# Patient Record
Sex: Female | Born: 1980 | Race: Black or African American | Hispanic: No | Marital: Married | Smoking: Former smoker
Health system: Southern US, Community
[De-identification: ages and names within clinical notes are randomized; demographics above are authoritative.]

## PROBLEM LIST (undated history)

## (undated) ENCOUNTER — Ambulatory Visit (HOSPITAL_COMMUNITY): Source: Home / Self Care

## (undated) DIAGNOSIS — R600 Localized edema: Secondary | ICD-10-CM

## (undated) DIAGNOSIS — R42 Dizziness and giddiness: Secondary | ICD-10-CM

## (undated) DIAGNOSIS — I10 Essential (primary) hypertension: Secondary | ICD-10-CM

## (undated) DIAGNOSIS — R002 Palpitations: Secondary | ICD-10-CM

## (undated) DIAGNOSIS — R079 Chest pain, unspecified: Secondary | ICD-10-CM

## (undated) HISTORY — DX: Dizziness and giddiness: R42

## (undated) HISTORY — DX: Chest pain, unspecified: R07.9

## (undated) HISTORY — DX: Essential (primary) hypertension: I10

## (undated) HISTORY — DX: Palpitations: R00.2

## (undated) HISTORY — PX: ABDOMINAL HYSTERECTOMY: SHX81

## (undated) HISTORY — PX: LAPAROSCOPIC HYSTERECTOMY: SHX1926

## (undated) HISTORY — PX: TONSILLECTOMY: SUR1361

## (undated) HISTORY — DX: Localized edema: R60.0

---

## 2013-06-28 DIAGNOSIS — R079 Chest pain, unspecified: Secondary | ICD-10-CM

## 2013-06-28 HISTORY — DX: Chest pain, unspecified: R07.9

## 2015-08-13 HISTORY — PX: ABDOMINAL HYSTERECTOMY: SHX81

## 2016-10-24 ENCOUNTER — Encounter: Payer: Self-pay | Admitting: Emergency Medicine

## 2016-10-24 ENCOUNTER — Emergency Department: Payer: Medicaid Other

## 2016-10-24 ENCOUNTER — Emergency Department
Admission: EM | Admit: 2016-10-24 | Discharge: 2016-10-24 | Disposition: A | Discharge status: Home / Self Care | Payer: Medicaid Other | Attending: Emergency Medicine | Admitting: Emergency Medicine

## 2016-10-24 DIAGNOSIS — M7989 Other specified soft tissue disorders: Secondary | ICD-10-CM | POA: Diagnosis not present

## 2016-10-24 DIAGNOSIS — Z87891 Personal history of nicotine dependence: Secondary | ICD-10-CM | POA: Diagnosis not present

## 2016-10-24 DIAGNOSIS — I10 Essential (primary) hypertension: Secondary | ICD-10-CM | POA: Diagnosis not present

## 2016-10-24 HISTORY — DX: Essential (primary) hypertension: I10

## 2016-10-24 LAB — BASIC METABOLIC PANEL
ANION GAP: 6 (ref 5–15)
BUN: 12 mg/dL (ref 6–20)
CALCIUM: 9.3 mg/dL (ref 8.9–10.3)
CHLORIDE: 104 mmol/L (ref 101–111)
CO2: 27 mmol/L (ref 22–32)
CREATININE: 0.81 mg/dL (ref 0.44–1.00)
GFR calc Af Amer: >60 mL/min (ref >60)
GFR calc non Af Amer: >60 mL/min (ref >60)
GLUCOSE: 120 mg/dL — AB (ref 65–99)
Potassium: 4 mmol/L (ref 3.5–5.1)
Sodium: 137 mmol/L (ref 135–145)

## 2016-10-24 LAB — CBC
HEMATOCRIT: 38.3 % (ref 35.0–47.0)
HEMOGLOBIN: 12.5 g/dL (ref 12.0–16.0)
MCH: 28.9 pg (ref 26.0–34.0)
MCHC: 32.7 g/dL (ref 32.0–36.0)
MCV: 88.1 fL (ref 80.0–100.0)
Platelets: 259 10*3/uL (ref 150–440)
RBC: 4.35 MIL/uL (ref 3.80–5.20)
RDW: 13.5 % (ref 11.5–14.5)
WBC: 7.7 10*3/uL (ref 3.6–11.0)

## 2016-10-24 LAB — TROPONIN I: Troponin I: <0.03 ng/mL (ref <0.03)

## 2016-10-24 MED ORDER — VERAPAMIL HCL 120 MG PO TABS: 120.0000 mg | ORAL_TABLET | Freq: Three times a day (TID) | ORAL | 1 refills | Status: DC

## 2016-10-24 NOTE — ED Provider Notes (Signed)
[facility], [facility] Emergency Department Provider Note  Time seen: 4:28 PM  I have reviewed the triage vital signs and the nursing notes.   HISTORY  Chief Complaint Leg Swelling and Medication Refill    HPI Christine Parks is a 36 y.o. female with a past medical history of hypertension presents to the emergency department with lower extremity swelling. According to the patient for the past month or 2 she has been experiencing intermittent swelling of her lower extremities. She states over the past one week her right lower extremity has been swollen more so than her left lower extremity. Denies any trauma to the area. Denies any discomfort. Patient denies chest pain or shortness of breath. Patient states she is supposed be taking estrogen therapy but has not been doing so. She also states approximately one week ago she developed a headache, states the headache comes and goes, denies any headache currently. Patient believes these symptoms are all related to having hypertension. States she has been unable to fill her verapamil for the past 3 months due to a Medicaid issue, and her not being able to get back in with her primary care doctor. Patient states she is currently working on this issue.  Past Medical History:  Diagnosis Date  . Hypertension     There are no active problems to display for this patient.   Past Surgical History:  Procedure Laterality Date  . ABDOMINAL HYSTERECTOMY    . TONSILLECTOMY      Prior to Admission medications   Not on File    Allergies  Allergen Reactions  . Penicillins Rash    No family history on file.  Social History Social History  Substance Use Topics  . Smoking status: Former Smoker    Types: Cigarettes    Quit date: 10/25/2014  . Smokeless tobacco: Never Used  . Alcohol use Yes     Comment: occasional     Review of Systems Constitutional: Negative for fever. Cardiovascular: Negative for chest pain. Respiratory:  Negative for shortness of breath. Gastrointestinal: Negative for abdominal pain Genitourinary: Negative for dysuria. Musculoskeletal: Bilateral lower extremity swelling at times, right lower extremity greater than left lower extremity. Denies any leg pain. Neurological: Intermittent headache 1 week 10-point ROS otherwise negative.  ____________________________________________   PHYSICAL EXAM:  VITAL SIGNS: ED Triage Vitals [10/24/16 1556]  Enc Vitals Group     BP (!) 175/98     Pulse Rate (!) 117     Resp 20     Temp 98 F (36.7 C)     Temp Source Oral     SpO2 100 %     Weight 220 lb (99.8 kg)     Height 5\' 4"  (1.626 m)     Head Circumference      Peak Flow      Pain Score      Pain Loc      Pain Edu?      Excl. in GC?     Constitutional: Alert and oriented. Well appearing and in no distress. Eyes: Normal exam ENT   Head: Normocephalic and atraumatic   Mouth/Throat: Mucous membranes are moist. Cardiovascular: Normal rate, regular rhythm. No murmur Respiratory: Normal respiratory effort without tachypnea nor retractions. Breath sounds are clear Gastrointestinal: Soft and nontender. No distention.   Musculoskeletal: Nontender with normal range of motion in all extremities. 1+ right lower extremity edema, no appreciable left lower extremity edema at this time. No calf tenderness in either lower extremity. 2+  DP pulse bilaterally. Neurologic:  Normal speech and language. No gross focal neurologic deficits  Skin:  Skin is warm, dry and intact.  Psychiatric: Mood and affect are normal.  ____________________________________________    INITIAL IMPRESSION / ASSESSMENT AND PLAN / ED COURSE  Pertinent labs & imaging results that were available during my care of the patient were reviewed by me and considered in my medical decision making (see chart for details).  The patient presents the emergency department with lower extremity swelling intermittent headache,  believes her symptoms are related to being off her blood pressure medications for the past 3 months. Patient is hypertensive 175/98 in the emergency department. We'll check basic labs including troponin, and a right lower extremity ultrasound. Overall the patient appears well, no distress, no complaints of chest pain or shortness of breath at any time. No leg discomfort. I discussed with the patient using compression stockings.  Ultrasound negative for DVT. Labs are largely within normal limits including negative troponin. We will discharge the patient with a refill for her verapamil. Patient will follow up with her primary care doctor.  ____________________________________________   FINAL CLINICAL IMPRESSION(S) / ED DIAGNOSES  Peripheral edema Hypertension    Minna Antis, MD 10/24/16 1725

## 2016-10-24 NOTE — ED Notes (Signed)
EDP at bedside  

## 2016-10-24 NOTE — ED Triage Notes (Signed)
Pt in via POV with complaints of intermittent BLE edema x approximately one week.  Pt also reports being out of blood pressure medication x 2-3 months; attempted to get medication refilled via cardiology but has been unable to due to insurance reasons..  Pt denies any pain at this time.  NAD noted at this time.

## 2016-10-24 NOTE — ED Notes (Signed)
Pt states bilateral leg swelling, worse in left then right, for 3 months, states she has been out of her Verapmil for 3 months, states she notices increased leg swelling when she wears non-supportive shoes, states hx of HTN, awake and alert, ambulatory

## 2016-10-28 ENCOUNTER — Telehealth: Payer: Self-pay | Admitting: Emergency Medicine

## 2016-10-28 NOTE — Telephone Encounter (Signed)
Patient called saying the verapamil is making her tired.  She has an appointment on Thursday with her cardiologist.  She has spoken to a pharmacist.  I explained the the physician who saw her here is not here now, and that it would be best for her to consult with the cardiologist on this.  I also spoke to her about why she had stopped the med for last few months.  She says she is going to have insurance from her job on [employer] [employer], but has applied for a transitional medicaid.  I told her about resources in the community for when she does not have insurance so that she will not stop the bp meds in the future.

## 2016-10-31 DIAGNOSIS — I10 Essential (primary) hypertension: Secondary | ICD-10-CM

## 2016-10-31 DIAGNOSIS — R002 Palpitations: Secondary | ICD-10-CM

## 2016-10-31 DIAGNOSIS — R600 Localized edema: Secondary | ICD-10-CM

## 2016-10-31 DIAGNOSIS — R42 Dizziness and giddiness: Secondary | ICD-10-CM

## 2016-10-31 HISTORY — DX: Palpitations: R00.2

## 2016-10-31 HISTORY — DX: Localized edema: R60.0

## 2016-10-31 HISTORY — DX: Essential (primary) hypertension: I10

## 2016-10-31 HISTORY — DX: Dizziness and giddiness: R42

## 2016-12-17 ENCOUNTER — Emergency Department (HOSPITAL_COMMUNITY)
Admission: EM | Admit: 2016-12-17 | Discharge: 2016-12-17 | Disposition: A | Discharge status: Home / Self Care | Payer: Medicaid Other | Attending: Emergency Medicine | Admitting: Emergency Medicine

## 2016-12-17 ENCOUNTER — Encounter (HOSPITAL_COMMUNITY): Payer: Self-pay | Admitting: Emergency Medicine

## 2016-12-17 DIAGNOSIS — I10 Essential (primary) hypertension: Secondary | ICD-10-CM | POA: Diagnosis not present

## 2016-12-17 DIAGNOSIS — Z87891 Personal history of nicotine dependence: Secondary | ICD-10-CM | POA: Insufficient documentation

## 2016-12-17 DIAGNOSIS — N3000 Acute cystitis without hematuria: Secondary | ICD-10-CM | POA: Diagnosis not present

## 2016-12-17 DIAGNOSIS — R300 Dysuria: Secondary | ICD-10-CM | POA: Diagnosis present

## 2016-12-17 LAB — URINALYSIS, ROUTINE W REFLEX MICROSCOPIC
Bilirubin Urine: NEGATIVE
Glucose, UA: NEGATIVE mg/dL
Hgb urine dipstick: NEGATIVE
KETONES UR: NEGATIVE mg/dL
Nitrite: NEGATIVE
Protein, ur: NEGATIVE mg/dL
Specific Gravity, Urine: 1.016 (ref 1.005–1.030)
pH: 5 (ref 5.0–8.0)

## 2016-12-17 LAB — CBC
HEMATOCRIT: 39.5 % (ref 36.0–46.0)
HEMOGLOBIN: 12.7 g/dL (ref 12.0–15.0)
MCH: 28.1 pg (ref 26.0–34.0)
MCHC: 32.2 g/dL (ref 30.0–36.0)
MCV: 87.4 fL (ref 78.0–100.0)
Platelets: 270 10*3/uL (ref 150–400)
RBC: 4.52 MIL/uL (ref 3.87–5.11)
RDW: 13.3 % (ref 11.5–15.5)
WBC: 4.8 10*3/uL (ref 4.0–10.5)

## 2016-12-17 LAB — BASIC METABOLIC PANEL
ANION GAP: 7 (ref 5–15)
BUN: 6 mg/dL (ref 6–20)
CALCIUM: 9 mg/dL (ref 8.9–10.3)
CO2: 25 mmol/L (ref 22–32)
Chloride: 106 mmol/L (ref 101–111)
Creatinine, Ser: 0.77 mg/dL (ref 0.44–1.00)
GFR calc Af Amer: >60 mL/min (ref >60)
Glucose, Bld: 99 mg/dL (ref 65–99)
POTASSIUM: 4.1 mmol/L (ref 3.5–5.1)
Sodium: 138 mmol/L (ref 135–145)

## 2016-12-17 MED ORDER — CEPHALEXIN 250 MG PO CAPS
500.0000 mg | ORAL_CAPSULE | Freq: Once | ORAL | Status: AC
  Administered 2016-12-17: 500 mg via ORAL
  Filled 2016-12-17: qty 2

## 2016-12-17 MED ORDER — ACETAMINOPHEN 500 MG PO TABS
1000.0000 mg | ORAL_TABLET | Freq: Once | ORAL | Status: AC
  Administered 2016-12-17: 1000 mg via ORAL
  Filled 2016-12-17: qty 2

## 2016-12-17 MED ORDER — PHENAZOPYRIDINE HCL 200 MG PO TABS: 200.0000 mg | ORAL_TABLET | Freq: Three times a day (TID) | ORAL | 0 refills | Status: DC

## 2016-12-17 MED ORDER — CEPHALEXIN 500 MG PO CAPS: 500.0000 mg | ORAL_CAPSULE | Freq: Two times a day (BID) | ORAL | 0 refills | Status: AC

## 2016-12-17 MED ORDER — PHENAZOPYRIDINE HCL 100 MG PO TABS
95.0000 mg | ORAL_TABLET | Freq: Once | ORAL | Status: AC
  Administered 2016-12-17: 100 mg via ORAL
  Filled 2016-12-17: qty 1

## 2016-12-17 NOTE — ED Triage Notes (Signed)
Pt c/o pain in abd and back, had a hysterectomy in 09/06/15-- gynecologist told pt that she had a "metal piece stuck in bladder-- and will have problems from that" pt was instructed to possibly f/u with urology-- pt has not had any f/u--   Pt c/o frequency/dysuria. With back pain--

## 2016-12-17 NOTE — Discharge Instructions (Signed)
Take your antibiotic as prescribed until completed. You may also take Tylenol or ibuprofen as prescribed over-the-counter Visine for chills and bodyaches. Continue turning fluids at home to remain hydrated. Please follow up with a primary care provider from the Resource Guide provided below in 3-4 days as needed. I recommend following up with the urology clinic listed below for further evaluation regarding your bladder/recurrent UTIs.  Please return to the Emergency Department if symptoms worsen or new onset of fever, back pain, flank pain, abdominal pain, vomiting, unable to keep fluids down, blood in urine, difficulty urinating.

## 2016-12-17 NOTE — ED Provider Notes (Signed)
[facility]-EMERGENCY DEPT Provider Note   CSN: [id number] Arrival date & time: 12/17/16  0865     History   Chief Complaint Chief Complaint  Patient presents with  . Nausea  . Fatigue  . Back Pain  . Urinary Tract Infection    HPI Christine Parks is a 36 y.o. female.  HPI   Patient is a 36 year old female with history of hypertension who presents the ED with complaint of UTI. Patient reports over the past week she has had dysuria, urinary frequency, chills and body aches. She reports having intermittent pain in her lower back. She also reports having one episode of vomiting yesterday. She notes her symptoms feel consistent with when she has had UTIs in the past. Patient states she had a hysterectomy performed in January 2017 and states her gynecologist told her she had a small piece of metal in her bladder and advised her to follow-up with a urologist due to having possible issues related to finding. Patient states she has not followed up with the urologist but notes she has had frequent UTIs over the past year. Denies any recent use of antibiotics. Denies fever, chest pain, shortness of breath, abdominal pain, hematemesis, flank pain, hematuria, vaginal bleeding, vaginal discharge.  Past Medical History:  Diagnosis Date  . Hypertension     There are no active problems to display for this patient.   Past Surgical History:  Procedure Laterality Date  . ABDOMINAL HYSTERECTOMY    . LAPAROSCOPIC HYSTERECTOMY    . TONSILLECTOMY      OB History    No data available       Home Medications    Prior to Admission medications   Medication Sig Start Date End Date Taking? Authorizing Provider  cephALEXin (KEFLEX) 500 MG capsule Take 1 capsule (500 mg total) by mouth 2 (two) times daily. 12/17/16 12/24/16  Barrett Henle, PA-C  phenazopyridine (PYRIDIUM) 200 MG tablet Take 1 tablet (200 mg total) by mouth 3 (three) times daily. 12/17/16   Barrett Henle, PA-C    verapamil (CALAN) 120 MG tablet Take 1 tablet (120 mg total) by mouth 3 (three) times daily. 10/24/16   Minna Antis, MD    Family History No family history on file.  Social History Social History  Substance Use Topics  . Smoking status: Former Smoker    Types: Cigarettes    Quit date: 10/25/2014  . Smokeless tobacco: Never Used  . Alcohol use Yes     Comment: occasional      Allergies   Penicillins   Review of Systems Review of Systems  Constitutional: Positive for chills.  Gastrointestinal: Positive for nausea and vomiting.  Genitourinary: Positive for dysuria and frequency.  Musculoskeletal: Positive for myalgias (generalized).  All other systems reviewed and are negative.    Physical Exam Updated Vital Signs BP 117/81   Pulse 72   Temp 98.7 F (37.1 C) (Oral)   Resp 16   SpO2 100%   Physical Exam  Constitutional: She is oriented to person, place, and time. She appears well-developed and well-nourished. No distress.  HENT:  Head: Normocephalic and atraumatic.  Mouth/Throat: Oropharynx is clear and moist. No oropharyngeal exudate.  Eyes: Conjunctivae and EOM are normal. Right eye exhibits no discharge. Left eye exhibits no discharge. No scleral icterus.  Neck: Normal range of motion. Neck supple.  Cardiovascular: Normal rate, regular rhythm, normal heart sounds and intact distal pulses.   Pulmonary/Chest: Effort normal and breath sounds normal. No respiratory distress.  She has no wheezes. She has no rales. She exhibits no tenderness.  Abdominal: Soft. Normal appearance and bowel sounds are normal. She exhibits no distension and no mass. There is no tenderness. There is no rigidity, no rebound, no guarding and no CVA tenderness. No hernia.  Musculoskeletal: Normal range of motion. She exhibits no edema.  Neurological: She is alert and oriented to person, place, and time.  Skin: Skin is warm and dry. She is not diaphoretic.  Nursing note and vitals  reviewed.    ED Treatments / Results  Labs (all labs ordered are listed, but only abnormal results are displayed) Labs Reviewed  URINALYSIS, ROUTINE W REFLEX MICROSCOPIC - Abnormal; Notable for the following:       Result Value   APPearance HAZY (*)    Leukocytes, UA LARGE (*)    Bacteria, UA MANY (*)    Squamous Epithelial / LPF 0-5 (*)    All other components within normal limits  URINE CULTURE  BASIC METABOLIC PANEL  CBC    EKG  EKG Interpretation None       Radiology No results found.  Procedures Procedures (including critical care time)  Medications Ordered in ED Medications  cephALEXin (KEFLEX) capsule 500 mg (500 mg Oral Given 12/17/16 1155)  phenazopyridine (PYRIDIUM) tablet 100 mg (100 mg Oral Given 12/17/16 1154)  acetaminophen (TYLENOL) tablet 1,000 mg (1,000 mg Oral Given 12/17/16 1155)     Initial Impression / Assessment and Plan / ED Course  I have reviewed the triage vital signs and the nursing notes.  Pertinent labs & imaging results that were available during my care of the patient were reviewed by me and considered in my medical decision making (see chart for details).     Patient presents with symptoms of UTI for the past week. Endorses associated chills, body aches, nausea and intermittent low back pain. Denies abdominal pain. Denies any recent antibiotic use but notes she has had frequent UTIs over the past year due to having a piece of metal in her bladder which she has not followed up with urology. VSS. Exam unremarkable. Abdomen soft, nontender. No CVA tenderness. UA shows evidence of infection, no hgb to suggest kidney stone. Labs unremarkable. Suspect pt's sxs related to UTI. No concern for pyelonephritis, kidney stone or other acute intraabdominal etiology warranting further workup at this time. Pt given initial dose of antibiotics in the ED. Urine culture sent due to pt reporting hx of recurrent UTIs. Discussed results and plan for discharge with  patient. Patient given information to follow-up with urology regarding her reported foreign body in bladder with history of recurrent UTIs. Discussed return precautions.  Final Clinical Impressions(s) / ED Diagnoses   Final diagnoses:  Acute cystitis without hematuria    New Prescriptions New Prescriptions   CEPHALEXIN (KEFLEX) 500 MG CAPSULE    Take 1 capsule (500 mg total) by mouth 2 (two) times daily.   PHENAZOPYRIDINE (PYRIDIUM) 200 MG TABLET    Take 1 tablet (200 mg total) by mouth 3 (three) times daily.     Barrett Henleadeau, Nicole Elizabeth, PA-C 12/17/16 1221    Alvira MondaySchlossman, Erin, MD 12/19/16 1743

## 2016-12-20 LAB — URINE CULTURE: Culture: >=100000 — AB

## 2016-12-21 ENCOUNTER — Telehealth: Payer: Self-pay

## 2016-12-21 NOTE — Telephone Encounter (Signed)
Post ED Visit - Positive Culture Follow-up  Culture report reviewed by antimicrobial stewardship pharmacist:  []  Enzo BiNathan Batchelder, Pharm.D. []  Celedonio MiyamotoJeremy Frens, Pharm.D., BCPS AQ-ID []  Garvin FilaMike Maccia, Pharm.D., BCPS []  Georgina PillionElizabeth Martin, Pharm.D., BCPS []  [place] BeachMinh Pham, VermontPharm.D., BCPS, AAHIVP []  Estella HuskMichelle Turner, Pharm.D., BCPS, AAHIVP []  Lysle Pearlachel Rumbarger, PharmD, BCPS []  Casilda Carlsaylor Stone, PharmD, BCPS []  Pollyann SamplesAndy Johnston, PharmD, BCPS [place] HunAllison Masters Pharm D Positive urine culture Treated with Cephalexin, organism sensitive to the same and no further patient follow-up is required at this time.  Jerry CarasCullom, Faiza Bansal Burnett 12/21/2016, 11:19 AM

## 2018-01-06 ENCOUNTER — Encounter: Payer: Self-pay | Admitting: Emergency Medicine

## 2018-01-06 ENCOUNTER — Emergency Department
Admission: EM | Admit: 2018-01-06 | Discharge: 2018-01-06 | Disposition: A | Discharge status: Home / Self Care | Payer: Self-pay | Attending: Emergency Medicine | Admitting: Emergency Medicine

## 2018-01-06 DIAGNOSIS — R000 Tachycardia, unspecified: Secondary | ICD-10-CM | POA: Insufficient documentation

## 2018-01-06 DIAGNOSIS — I10 Essential (primary) hypertension: Secondary | ICD-10-CM | POA: Insufficient documentation

## 2018-01-06 DIAGNOSIS — Z87891 Personal history of nicotine dependence: Secondary | ICD-10-CM | POA: Insufficient documentation

## 2018-01-06 DIAGNOSIS — R42 Dizziness and giddiness: Secondary | ICD-10-CM | POA: Insufficient documentation

## 2018-01-06 LAB — BASIC METABOLIC PANEL
Anion gap: 10 (ref 5–15)
BUN: 11 mg/dL (ref 6–20)
CHLORIDE: 106 mmol/L (ref 101–111)
CO2: 21 mmol/L — ABNORMAL LOW (ref 22–32)
CREATININE: 0.75 mg/dL (ref 0.44–1.00)
Calcium: 9.2 mg/dL (ref 8.9–10.3)
GFR calc Af Amer: >60 mL/min (ref >60)
GFR calc non Af Amer: >60 mL/min (ref >60)
GLUCOSE: 108 mg/dL — AB (ref 65–99)
POTASSIUM: 3.6 mmol/L (ref 3.5–5.1)
SODIUM: 137 mmol/L (ref 135–145)

## 2018-01-06 LAB — CBC
HEMATOCRIT: 40.1 % (ref 35.0–47.0)
Hemoglobin: 13.5 g/dL (ref 12.0–16.0)
MCH: 29.3 pg (ref 26.0–34.0)
MCHC: 33.5 g/dL (ref 32.0–36.0)
MCV: 87.3 fL (ref 80.0–100.0)
PLATELETS: 234 10*3/uL (ref 150–440)
RBC: 4.6 MIL/uL (ref 3.80–5.20)
RDW: 13 % (ref 11.5–14.5)
WBC: 8 10*3/uL (ref 3.6–11.0)

## 2018-01-06 LAB — GLUCOSE, CAPILLARY: Glucose-Capillary: 96 mg/dL (ref 65–99)

## 2018-01-06 MED ORDER — VERAPAMIL HCL ER 120 MG PO TBCR: 120.0000 mg | EXTENDED_RELEASE_TABLET | Freq: Every day | ORAL | 0 refills | Status: DC

## 2018-01-06 MED ORDER — VERAPAMIL HCL 120 MG PO TABS
120.0000 mg | ORAL_TABLET | Freq: Once | ORAL | Status: AC
  Administered 2018-01-06: 120 mg via ORAL
  Filled 2018-01-06: qty 1

## 2018-01-06 MED ORDER — MECLIZINE HCL 25 MG PO TABS: 25.0000 mg | ORAL_TABLET | Freq: Three times a day (TID) | ORAL | 0 refills | Status: DC | PRN

## 2018-01-06 MED ORDER — MECLIZINE HCL 25 MG PO TABS
25.0000 mg | ORAL_TABLET | Freq: Once | ORAL | Status: AC
  Administered 2018-01-06: 25 mg via ORAL
  Filled 2018-01-06: qty 1

## 2018-01-06 MED ORDER — VERAPAMIL HCL ER 120 MG PO TBCR
120.0000 mg | EXTENDED_RELEASE_TABLET | Freq: Once | ORAL | Status: DC
  Filled 2018-01-06 (×2): qty 1

## 2018-01-06 NOTE — ED Triage Notes (Signed)
Pt ambulated with a steady gait to triage room. Pt arrived with complaints of dizziness present with changing of positions. Pt states "it's gotten better though but I got to get it checked out because I cannot be standing up and getting dizzy like that." 204/105 blood pressure 115 HR when standing.

## 2018-01-06 NOTE — ED Notes (Signed)
Dr. Siadecki at bedside 

## 2018-01-06 NOTE — ED Provider Notes (Signed)
[facility] Emergency Department Provider Note ____________________________________________   First MD Initiated Contact with Patient 01/06/18 1621     (approximate)  I have reviewed the triage vital signs and the nursing notes.   HISTORY  Chief Complaint Dizziness (with change of positions )    HPI Temprence Faith Rawe is a 37 y.o. female with PMH as noted below who presents with dizziness, intermittent, mainly occurring when she changes position or stands up from sitting or lying down, and relieved with lying down, and described both as lightheadedness, but also as a movement or spinning sensation.  She states it is worse when there is a lot of pollen.  She reports that has been happening intermittently over a few months, but worse in the last several days, with an episode earlier today.  The patient states that she was previously on verapamil for her blood pressure (and elevated heart rate) but self discontinued after the prescription expired late last year, because she felt that her dose was too high and she thought that her blood pressure was probably okay.  Patient denies headache, chest pain or difficulty breathing, palpitations, or any recent illness.  Past Medical History:  Diagnosis Date  . Hypertension     There are no active problems to display for this patient.   Past Surgical History:  Procedure Laterality Date  . ABDOMINAL HYSTERECTOMY    . LAPAROSCOPIC HYSTERECTOMY    . TONSILLECTOMY      Prior to Admission medications   Medication Sig Start Date End Date Taking? Authorizing Provider  meclizine (ANTIVERT) 25 MG tablet Take 1 tablet (25 mg total) by mouth 3 (three) times daily as needed for dizziness. 01/06/18   Dionne Bucy, MD  phenazopyridine (PYRIDIUM) 200 MG tablet Take 1 tablet (200 mg total) by mouth 3 (three) times daily. Patient not taking: Reported on 01/06/2018 12/17/16   Barrett Henle, PA-C  verapamil  (CALAN-SR) 120 MG CR tablet Take 1 tablet (120 mg total) by mouth daily. 01/07/18 02/06/18  Dionne Bucy, MD    Allergies Penicillins  No family history on file.  Social History Social History   Tobacco Use  . Smoking status: Former Smoker    Types: Cigarettes    Last attempt to quit: 10/25/2014    Years since quitting: 3.2  . Smokeless tobacco: Never Used  Substance Use Topics  . Alcohol use: Yes    Comment: occasional   . Drug use: No    Review of Systems  Constitutional: No fever/. Eyes: No neck pain. ENT: No neck pain. Cardiovascular: Denies chest pain. Respiratory: Denies shortness of breath. Gastrointestinal: No nausea or vomiting. Genitourinary: Negative for flank pain.  Musculoskeletal: Negative for back pain. Skin: Negative for rash. Neurological: Negative for headache.   ____________________________________________   PHYSICAL EXAM:  VITAL SIGNS: ED Triage Vitals  Enc Vitals Group     BP 01/06/18 1315 (!) 165/83     Pulse Rate 01/06/18 1315 (!) 120     Resp 01/06/18 1315 18     Temp 01/06/18 1315 98.7 F (37.1 C)     Temp Source 01/06/18 1315 Oral     SpO2 01/06/18 1315 100 %     Weight 01/06/18 1316 220 lb (99.8 kg)     Height 01/06/18 1316  (1.626 m)     Head Circumference --      Peak Flow --      Pain Score 01/06/18 1315 0     Pain Loc --  Pain Edu? --      Excl. in GC? --     Constitutional: Alert and oriented. Well appearing and in no acute distress. Eyes: Conjunctivae are normal.  EOMI. Head: Atraumatic. Nose: No congestion/rhinnorhea. Mouth/Throat: Mucous membranes are moist.   Neck: Normal range of motion.  Cardiovascular: Normal rate, regular rhythm. Grossly normal heart sounds.  Good peripheral circulation. Respiratory: Normal respiratory effort.  No retractions. Lungs CTAB. Gastrointestinal:  No distention.  Musculoskeletal: Extremities warm and well perfused.  Neurologic:  Normal speech and language.  Motor and  sensory intact in all extremities.  Normal coordination with no ataxia on finger-to-nose.  No gross focal neurologic deficits are appreciated.  Skin:  Skin is warm and dry. No rash noted. Psychiatric: Mood and affect are normal. Speech and behavior are normal.  ____________________________________________   LABS (all labs ordered are listed, but only abnormal results are displayed)  Labs Reviewed  BASIC METABOLIC PANEL - Abnormal; Notable for the following components:      Result Value   CO2 21 (*)    Glucose, Bld 108 (*)    All other components within normal limits  CBC  GLUCOSE, CAPILLARY  URINALYSIS, COMPLETE (UACMP) WITH MICROSCOPIC  CBG MONITORING, ED  POC URINE PREG, ED   ____________________________________________  EKG  ED ECG REPORT I, Dionne Bucy, the attending physician, personally viewed and interpreted this ECG.  Date: 01/06/2018 EKG Time: 1322 Rate: 125 Rhythm: Sinus tachycardia QRS Axis: normal Intervals: normal ST/T Wave abnormalities: n nonspecific lateral T wave flattening Narrative Interpretation: Nonspecific possible lateral T wave abnormalities with no evidence of acute ischemia; no prior EKG available for comparison    ____________________________________________  RADIOLOGY    ____________________________________________   PROCEDURES  Procedure(s) performed: No  Procedures  Critical Care performed: No ____________________________________________   INITIAL IMPRESSION / ASSESSMENT AND PLAN / ED COURSE  Pertinent labs & imaging results that were available during my care of the patient were reviewed by me and considered in my medical decision making (see chart for details).  37 year old female with past medical history of hypertension who has been noncompliant with her verapamil for the last several months presents with intermittent dizziness, with both a lightheadedness and a vertiginous component.  She denies any significant  associated symptoms.  No relevant past medical records available on review of patient's chart in Epic.  On exam, the patient is well-appearing.  She was somewhat hypertensive initially, and her heart rate was initially in the 120s although it is now around 100.  Patient reports a prior history of tachycardia, and states this is not unusual for her.  The remainder of the exam is unremarkable.  Neuro exam is normal.  Differential includes peripheral vertigo, nonspecific symptoms related to elevated blood pressure, dehydration.  There is no clinical evidence to suggest cardiac etiology.  Patient has nonspecific slowly flat T waves on EKG, but no chest pain or difficulty breathing or other symptoms suggestive of ACS, no indication for further cardiac work-up.  Patient's labs are unremarkable.  We will give a dose of meclizine, as well as a dose of verapamil which the patient was previously on, and reassess.  Anticipate discharge home with outpatient cardiology follow-up and plan to resume her antihypertensive.     ----------------------------------------- 6:05 PM on 01/06/2018 -----------------------------------------  Patient's heart rate was 98 when her last measure.  She reports improved symptoms.  She feels well to go home.  I have provided her with referral to [facility] health cardiology in [place] (where  she lives) since she does not currently have a cardiologist.  She has been given a one-month supply of verapamil which she previously was taking with good response.  She also has been given a prescription for meclizine.  Return precautions given, and the patient expresses understanding.  ____________________________________________   FINAL CLINICAL IMPRESSION(S) / ED DIAGNOSES  Final diagnoses:  Dizziness  Hypertension, unspecified type      NEW MEDICATIONS STARTED DURING THIS VISIT:  New Prescriptions   MECLIZINE (ANTIVERT) 25 MG TABLET    Take 1 tablet (25 mg total) by mouth 3  (three) times daily as needed for dizziness.   VERAPAMIL (CALAN-SR) 120 MG CR TABLET    Take 1 tablet (120 mg total) by mouth daily.     Note:  This document was prepared using Dragon voice recognition software and may include unintentional dictation errors.    Dionne Bucy, MD 01/06/18 1806

## 2018-01-06 NOTE — Discharge Instructions (Signed)
Take the verapamil as prescribed starting tomorrow.  We have given you a 1 month supply so that you have enough until you follow-up with a new cardiologist.  You may take the meclizine as needed for dizziness or spinning.  Return to the ER for new, worsening, or persistent dizziness, lightheadedness, headache, chest pain, difficulty breathing, or any other new or worsening symptoms that concern you.

## 2018-01-06 NOTE — ED Notes (Signed)
Called pharmacy stating that verapamil has not been received in ED. This RN stated that she could see that it had been verified. Pharmacy stated they would send it again. Awaiting on medication at this time. Informed pt in delay of care.

## 2018-01-06 NOTE — ED Notes (Signed)
Pt c/o dizziness that began this AM. States she hasn't taken BP meds at all in 2019 because she doesn't like how they make her feel. States her doctor has changed her BP before but both have made her feel bad and she doesn't take anything for BP at this time. Alert, oriented, ambulated to 1H. Speaking in clear, complete sentences. No distress noted.

## 2018-01-15 DIAGNOSIS — R9431 Abnormal electrocardiogram [ECG] [EKG]: Secondary | ICD-10-CM | POA: Insufficient documentation

## 2018-01-15 HISTORY — DX: Abnormal electrocardiogram (ECG) (EKG): R94.31

## 2018-01-15 NOTE — Progress Notes (Deleted)
Cardiology Office Note:    Date:  01/16/2018   ID:  Christine Parks, DOB 13-Jan-1981, MRN [id number]  PCP:  Mosetta PuttBlomgren, Peter, MD  Cardiologist:  Norman HerrlichBrian Munley, MD   Referring MD: Mosetta PuttBlomgren, Peter, MD  ASSESSMENT:    1. Abnormal EKG    PLAN:    In order of problems listed above:  1. ***  Next appointment   Medication Adjustments/Labs and Tests Ordered: Current medicines are reviewed at length with the patient today.  Concerns regarding medicines are outlined above.  No orders of the defined types were placed in this encounter.  No orders of the defined types were placed in this encounter.    No chief complaint on file. ***  History of Present Illness:    Christine Parks is a 37 y.o. female who is being seen today for the evaluation of an abnormal EKG after [facility]  [facility] with elevated BP and dizziness at the request of Mosetta PuttBlomgren, Peter, MD.  EKG 01/06/18 showed sinus tachycardia 125 BPM and non specific T waves.   She was seen by cardiology in 2014 for chest pain and 2018 for hypertesnion.  Past Medical History:  Diagnosis Date  . Bilateral leg edema 10/31/2016   Last Assessment & Plan:  Patient does have bilateral lower extremity edema she says that is coming from the verapamil 120 3 times a day since the dose is higher than her regular dose of 240 could be contributing for it we'll try to change her to Verapamil SR 240 mg a day and see how she does if she continues to have lower extremity edema we'll check echocardiogram.  . Chest pain 06/28/2013   Last Assessment & Plan:  Patient reports intermittent chest pain accompanied by dyspnea and anxiety.  Her pain has pleuritic component to it.  However patient is unable to exercise the past several month due to complaints of exertional dyspnea.  She tells that she feels unsafe.  Therefore will investigate with exercise echocardiogram.  . Dizziness 10/31/2016   Last Assessment & Plan:  As above.  We'll try to change  her to Verapamil SR and see how she does if she continues to have problems then we will consider changing it.  . Essential hypertension 10/31/2016   Last Assessment & Plan:  Patient with known hypertension did very well with the meropenem will SR 240 mg a day.  Her primary was giving her verapamil 120 mg 3 times a day and which is not helping her blood pressure on top of that she is having a lot of side effects with the edema and dyspnea on exertion and palpitations.  She wants to get back on her regular where panel to 40 mg SR so we'll go ahe  . Hypertension   . Palpitations 10/31/2016   Last Assessment & Plan:  Patient says that palpitations are also happening since she is on short-acting meropenem L plan as about we'll change it and really reevaluate how the palpitations are.    Past Surgical History:  Procedure Laterality Date  . ABDOMINAL HYSTERECTOMY    . LAPAROSCOPIC HYSTERECTOMY    . TONSILLECTOMY      Current Medications: No outpatient medications have been marked as taking for the 01/16/18 encounter (Appointment) with Baldo DaubMunley, Brian J, MD.     Allergies:   Penicillins   Social History   Socioeconomic History  . Marital status: Married    Spouse name: Not on file  . Number of children: Not on  file  . Years of education: Not on file  . Highest education level: Not on file  Occupational History  . Not on file  Social Needs  . Financial resource strain: Not on file  . Food insecurity:    Worry: Not on file    Inability: Not on file  . Transportation needs:    Medical: Not on file    Non-medical: Not on file  Tobacco Use  . Smoking status: Former Smoker    Types: Cigarettes    Last attempt to quit: 10/25/2014    Years since quitting: 3.2  . Smokeless tobacco: Never Used  Substance and Sexual Activity  . Alcohol use: Yes    Comment: occasional   . Drug use: No  . Sexual activity: Yes  Lifestyle  . Physical activity:    Days per week: Not on file    Minutes per  session: Not on file  . Stress: Not on file  Relationships  . Social connections:    Talks on phone: Not on file    Gets together: Not on file    Attends religious service: Not on file    Active member of club or organization: Not on file    Attends meetings of clubs or organizations: Not on file    Relationship status: Not on file  Other Topics Concern  . Not on file  Social History Narrative  . Not on file     Family History: The patient's ***family history is not on file.  ROS:   ROS Please see the history of present illness.    *** All other systems reviewed and are negative.  EKGs/Labs/Other Studies Reviewed:    The following studies were reviewed today: ***  EKG:  EKG is *** ordered today.  The ekg ordered today demonstrates ***  Recent Labs: 01/06/2018: BUN 11; Creatinine, Ser 0.75; Hemoglobin 13.5; Platelets 234; Potassium 3.6; Sodium 137  Recent Lipid Panel No results found for: CHOL, TRIG, HDL, CHOLHDL, VLDL, LDLCALC, LDLDIRECT  Physical Exam:    VS:  There were no vitals taken for this visit.    Wt Readings from Last 3 Encounters:  01/06/18 220 lb (99.8 kg)  10/24/16 220 lb (99.8 kg)     GEN: *** Well nourished, well developed in no acute distress HEENT: Normal NECK: No JVD; No carotid bruits LYMPHATICS: No lymphadenopathy CARDIAC: ***RRR, no murmurs, rubs, gallops RESPIRATORY:  Clear to auscultation without rales, wheezing or rhonchi  ABDOMEN: Soft, non-tender, non-distended MUSCULOSKELETAL:  No edema; No deformity  SKIN: Warm and dry NEUROLOGIC:  Alert and oriented x 3 PSYCHIATRIC:  Normal affect     Signed, Norman Herrlich, MD  01/16/2018 8:51 AM    [facility]

## 2018-01-16 ENCOUNTER — Ambulatory Visit: Payer: Self-pay | Admitting: Cardiology

## 2018-01-20 ENCOUNTER — Encounter: Payer: Self-pay | Admitting: Cardiology

## 2018-01-20 ENCOUNTER — Ambulatory Visit (INDEPENDENT_AMBULATORY_CARE_PROVIDER_SITE_OTHER): Payer: Self-pay | Admitting: Cardiology

## 2018-01-20 VITALS — BP 128/82 | HR 97 | Ht 64.0 in | Wt 256.1 lb

## 2018-01-20 DIAGNOSIS — R079 Chest pain, unspecified: Secondary | ICD-10-CM

## 2018-01-20 DIAGNOSIS — R002 Palpitations: Secondary | ICD-10-CM

## 2018-01-20 DIAGNOSIS — I10 Essential (primary) hypertension: Secondary | ICD-10-CM

## 2018-01-20 NOTE — Patient Instructions (Signed)
Medication Instructions:  Your physician recommends that you continue on your current medications as directed. Please refer to the Current Medication list given to you today.  Labwork: None  Testing/Procedures: Your physician has requested that you have a stress echocardiogram. For further information please visit [email or web address]. Please follow instruction sheet as given.  Your physician has recommended that you wear a holter monitor. Holter monitors are medical devices that record the heart's electrical activity. Doctors most often use these monitors to diagnose arrhythmias. Arrhythmias are problems with the speed or rhythm of the heartbeat. The monitor is a small, portable device. You can wear one while you do your normal daily activities. This is usually used to diagnose what is causing palpitations/syncope (passing out).  Follow-Up: Your physician recommends that you schedule a follow-up appointment in: 3 months  Any Other Special Instructions Will Be Listed Below (If Applicable).     If you need a refill on your cardiac medications before your next appointment, please call your pharmacy.   [facility]  Garey HamAshley A, RN, BSN    Cardiopulmonary Exercise Stress Test Cardiopulmonary exercise testing (CPET) is a test that checks how your heart and lungs react to exercise. This is called your exercise capacity. During this test, you will walk or run on a treadmill or pedal on a stationary bike while tests are done on your heart and lungs. You may have this test to:  See why you are short of breath.  Check for exercise intolerance.  See how your lungs work.  See how your heart works.  Check for how you are responding to a heart or lung rehabilitation program.  See if you have a heart or lung problem.  See if you are healthy enough to have surgery.  What happens before the procedure?  Follow instructions from your doctor about what you cannot eat or drink.  Ask your  doctor about changing or stopping your normal medicines. This is important if you take diabetes medicines or blood thinners.  Wear loose, comfortable clothing and shoes.  If you use an inhaler, bring it with you to the test. What happens during the procedure?  A blood pressure cuff will be placed on your arm.  Several stick-on patches (electrodes) will be placed on your chest. They will be attached to an electrocardiogram (EKG) machine.  A clip-on monitor that measures the amount of oxygen in your blood will be placed on your finger (pulse oximeter).  A clip will be placed on your nose and a mouthpiece will be placed in your mouth. This may be held in place with a headpiece. You will breathe through the mouthpiece during the test.  You will be asked to start exercising. You will be closely watched while you exercise.  The amount of effort for your exercise will be gradually increased.  During exercise, the test will measure: ? Your heart rate. ? Your heart rhythm. ? Your oxygen blood level. ? The amount of oxygen and carbon dioxide that you breathe out.  The test will end when: ? You have finished the test. ? You have reached your maximum ability to exercise. ? You have chest or leg pain, dizziness, or shortness of breath. The procedure may vary among doctors and hospitals. What happens after the procedure?  Your blood pressure and EKG will be checked to watch how you recover from the test. This information is not intended to replace advice given to you by your health care provider. Make sure you discuss  any questions you have with your health care provider. Document Released: 07/17/2009 Document Revised: 12/19/2015 Document Reviewed: 06/12/2015 Elsevier Interactive Patient Education  2018 [employer].  Holter Monitoring A Holter monitor is a small device that is used to detect abnormal heart rhythms. It clips to your clothing and is connected by wires to flat, sticky disks  (electrodes) that attach to your chest. It is worn continuously for 24-48 hours. Follow these instructions at home:  Wear your Holter monitor at all times, even while exercising and sleeping, for as long as directed by your health care provider.  Make sure that the Holter monitor is safely clipped to your clothing or close to your body as recommended by your health care provider.  Do not get the monitor or wires wet.  Do not put body lotion or moisturizer on your chest.  Keep your skin clean.  Keep a diary of your daily activities, such as walking and doing chores. If you feel that your heartbeat is abnormal or that your heart is fluttering or skipping a beat: ? Record what you are doing when it happens. ? Record what time of day the symptoms occur.  Return your Holter monitor as directed by your health care provider.  Keep all follow-up visits as directed by your health care provider. This is important. Get help right away if:  You feel lightheaded or you faint.  You have trouble breathing.  You feel pain in your chest, upper arm, or jaw.  You feel sick to your stomach and your skin is pale, cool, or damp.  You heartbeat feels unusual or abnormal. This information is not intended to replace advice given to you by your health care provider. Make sure you discuss any questions you have with your health care provider. Document Released: 04/26/2004 Document Revised: 01/04/2016 Document Reviewed: 03/07/2014 Elsevier Interactive Patient Education  [employer].

## 2018-01-20 NOTE — Progress Notes (Signed)
Cardiology Office Note:    Date:  01/20/2018   ID:  Christine Parks, DOB 10/30/1980, MRN [id number]  PCP:  Patient, No Pcp Per  Cardiologist:  Garwin Brothersajan R Zerah Hilyer, MD   Referring MD: Mosetta PuttBlomgren, Peter, MD    ASSESSMENT:    1. Essential hypertension   2. Chest pain, unspecified type   3. Palpitations    PLAN:    In order of problems listed above:  1. Primary prevention stressed with the patient.  Importance of compliance with diet and medications stressed and she vocalized understanding.  I reassured her about my findings. 2. In view of her symptoms she will undergo to 48-hour Holter monitor.  Her lipids are followed by her primary care physician. 3. I have scheduled her for a exercise stress echo in view of her symptoms. 4. Diet was discussed for obesity and risks of obesity explained and she vocalized understanding. 5. She knows to go to the nearest emergency room for any significant concerns.Patient will be seen in follow-up appointment in 3 months or earlier if the patient has any concerns    Medication Adjustments/Labs and Tests Ordered: Current medicines are reviewed at length with the patient today.  Concerns regarding medicines are outlined above.  No orders of the defined types were placed in this encounter.  No orders of the defined types were placed in this encounter.    History of Present Illness:    Christine Parks is a 37 y.o. female who is being seen today for the evaluation of chest tightness and palpitations at the request of Mosetta PuttBlomgren, Peter, MD.  Patient is a pleasant 37 year old female.  She has past medical history of essential hypertension.  [facility]  She is moved to this area.  She complained of some dizziness and went to the emergency room and was evaluated.  She was discharged with an appointment with us.  She denies any chest pain orthopnea or PND.  She occasionally has some chest tightness symptoms not  related to exertion.  She leads a sedentary lifestyle.  I asked in particular if sexual activity reproduces the symptoms and she answered in the negative.  No syncopal spells.  No orthopnea or PND.  She does have symptoms of palpitations at times pretty much on a daily basis.  At the time of my evaluation, the patient is alert awake oriented and in no distress.  Past Medical History:  Diagnosis Date  . Bilateral leg edema 10/31/2016   Last Assessment & Plan:  Patient does have bilateral lower extremity edema she says that is coming from the verapamil 120 3 times a day since the dose is higher than her regular dose of 240 could be contributing for it we'll try to change her to Verapamil SR 240 mg a day and see how she does if she continues to have lower extremity edema we'll check echocardiogram.  . Chest pain 06/28/2013   Last Assessment & Plan:  Patient reports intermittent chest pain accompanied by dyspnea and anxiety.  Her pain has pleuritic component to it.  However patient is unable to exercise the past several month due to complaints of exertional dyspnea.  She tells that she feels unsafe.  Therefore will investigate with exercise echocardiogram.  . Dizziness 10/31/2016   Last Assessment & Plan:  As above.  We'll try to change her to Verapamil SR and see how she does if she continues to have problems then we will consider changing  it.  . Essential hypertension 10/31/2016   Last Assessment & Plan:  Patient with known hypertension did very well with the meropenem will SR 240 mg a day.  Her primary was giving her verapamil 120 mg 3 times a day and which is not helping her blood pressure on top of that she is having a lot of side effects with the edema and dyspnea on exertion and palpitations.  She wants to get back on her regular where panel to 40 mg SR so we'll go ahe  . Hypertension   . Palpitations 10/31/2016   Last Assessment & Plan:  Patient says that palpitations are also happening since she is on  short-acting meropenem L plan as about we'll change it and really reevaluate how the palpitations are.    Past Surgical History:  Procedure Laterality Date  . ABDOMINAL HYSTERECTOMY    . LAPAROSCOPIC HYSTERECTOMY    . TONSILLECTOMY      Current Medications: Current Meds  Medication Sig  . meclizine (ANTIVERT) 25 MG tablet Take 1 tablet (25 mg total) by mouth 3 (three) times daily as needed for dizziness.  . verapamil (CALAN-SR) 120 MG CR tablet Take 1 tablet (120 mg total) by mouth daily.     Allergies:   Penicillins   Social History   Socioeconomic History  . Marital status: Married    Spouse name: Not on file  . Number of children: Not on file  . Years of education: Not on file  . Highest education level: Not on file  Occupational History  . Not on file  Social Needs  . Financial resource strain: Not on file  . Food insecurity:    Worry: Not on file    Inability: Not on file  . Transportation needs:    Medical: Not on file    Non-medical: Not on file  Tobacco Use  . Smoking status: Former Smoker    Types: Cigarettes    Last attempt to quit: 10/25/2014    Years since quitting: 3.2  . Smokeless tobacco: Never Used  Substance and Sexual Activity  . Alcohol use: Yes    Comment: occasional   . Drug use: No  . Sexual activity: Yes  Lifestyle  . Physical activity:    Days per week: Not on file    Minutes per session: Not on file  . Stress: Not on file  Relationships  . Social connections:    Talks on phone: Not on file    Gets together: Not on file    Attends religious service: Not on file    Active member of club or organization: Not on file    Attends meetings of clubs or organizations: Not on file    Relationship status: Not on file  Other Topics Concern  . Not on file  Social History Narrative  . Not on file     Family History: The patient's family history includes Diabetes in her father and mother; Hypertension in her father and mother.  ROS:     Please see the history of present illness.    All other systems reviewed and are negative.  EKGs/Labs/Other Studies Reviewed:    The following studies were reviewed today: EKG reveals sinus rhythm and nonspecific ST-T changes.   Recent Labs: 01/06/2018: BUN 11; Creatinine, Ser 0.75; Hemoglobin 13.5; Platelets 234; Potassium 3.6; Sodium 137  Recent Lipid Panel No results found for: CHOL, TRIG, HDL, CHOLHDL, VLDL, LDLCALC, LDLDIRECT  Physical Exam:    VS:  BP  128/82 (BP Location: Right Arm, Patient Position: Sitting, Cuff Size: Normal)   Pulse 97   Ht 5\' 4"  (1.626 m)   Wt 256 lb 1.9 oz (116.2 kg)   SpO2 98%   BMI 43.96 kg/m     Wt Readings from Last 3 Encounters:  01/20/18 256 lb 1.9 oz (116.2 kg)  01/06/18 220 lb (99.8 kg)  10/24/16 220 lb (99.8 kg)     GEN: Patient is in no acute distress HEENT: Normal NECK: No JVD; No carotid bruits LYMPHATICS: No lymphadenopathy CARDIAC: S1 S2 regular, 2/6 systolic murmur at the apex. RESPIRATORY:  Clear to auscultation without rales, wheezing or rhonchi  ABDOMEN: Soft, non-tender, non-distended MUSCULOSKELETAL:  No edema; No deformity  SKIN: Warm and dry NEUROLOGIC:  Alert and oriented x 3 PSYCHIATRIC:  Normal affect    Signed, Garwin Brothers, MD  01/20/2018 10:47 AM    [facility]

## 2018-01-20 NOTE — Addendum Note (Signed)
Addended by: Craige CottaANDERSON, Day Greb S on: 01/20/2018 10:52 AM   Modules accepted: Orders

## 2018-02-11 ENCOUNTER — Telehealth: Payer: Self-pay | Admitting: Cardiology

## 2018-02-11 MED ORDER — VERAPAMIL HCL ER 120 MG PO TBCR: 120.0000 mg | EXTENDED_RELEASE_TABLET | Freq: Every day | ORAL | 3 refills | Status: DC

## 2018-02-11 NOTE — Telephone Encounter (Signed)
Refill for verapamil 120 [place] daily [facility] [place] requested by patient.

## 2018-02-11 NOTE — Telephone Encounter (Signed)
Please send refill of verapamil 120mg  to [facility] on [address] [place]

## 2018-02-17 ENCOUNTER — Ambulatory Visit (HOSPITAL_BASED_OUTPATIENT_CLINIC_OR_DEPARTMENT_OTHER): Payer: Medicaid Other

## 2018-03-09 ENCOUNTER — Ambulatory Visit (HOSPITAL_BASED_OUTPATIENT_CLINIC_OR_DEPARTMENT_OTHER): Admission: RE | Admit: 2018-03-09 | Payer: Medicaid Other | Source: Ambulatory Visit

## 2018-03-10 ENCOUNTER — Encounter: Payer: Self-pay | Admitting: Cardiology

## 2018-05-13 ENCOUNTER — Emergency Department: Payer: Self-pay

## 2018-05-13 ENCOUNTER — Encounter: Payer: Self-pay | Admitting: Emergency Medicine

## 2018-05-13 ENCOUNTER — Emergency Department
Admission: EM | Admit: 2018-05-13 | Discharge: 2018-05-13 | Disposition: A | Discharge status: Home / Self Care | Payer: Self-pay | Attending: Emergency Medicine | Admitting: Emergency Medicine

## 2018-05-13 DIAGNOSIS — G5762 Lesion of plantar nerve, left lower limb: Secondary | ICD-10-CM | POA: Insufficient documentation

## 2018-05-13 DIAGNOSIS — Z79899 Other long term (current) drug therapy: Secondary | ICD-10-CM | POA: Insufficient documentation

## 2018-05-13 DIAGNOSIS — I10 Essential (primary) hypertension: Secondary | ICD-10-CM | POA: Insufficient documentation

## 2018-05-13 MED ORDER — MELOXICAM 15 MG PO TABS: 15.0000 mg | ORAL_TABLET | Freq: Every day | ORAL | 0 refills | Status: DC

## 2018-05-13 NOTE — ED Triage Notes (Signed)
Patient presents to the ED with left foot swelling and pain to the top of her foot x 3 weeks.  Patient is ambulatory to triage.  Patient states icing foot improves pain and swelling somewhat.  Patient is in no obvious distress at this time.

## 2018-05-13 NOTE — ED Provider Notes (Signed)
[facility] Emergency Department Provider Note  ____________________________________________  Time seen: Approximately 8:17 PM  I have reviewed the triage vital signs and the nursing notes.   HISTORY  Chief Complaint Foot Pain    HPI Christine Parks is a 37 y.o. female who presents the emergency department complaining of left foot pain.  Patient reports that approximately 3 weeks ago she began to experience a burning/tender sensation in her midfoot radiating into her toes.  Patient denies any trauma to the area.  She does have chronic, bilateral lower leg edema but denies any change from baseline.  She denies any visible swelling, erythema.  She denies any ecchymosis.  Patient reports that at rest she has little to no pain, but with weightbearing sensation returns.  No history of similar complaint in the past.  No other complaints at this time.    Past Medical History:  Diagnosis Date  . Bilateral leg edema 10/31/2016   Last Assessment & Plan:  Patient does have bilateral lower extremity edema she says that is coming from the verapamil 120 3 times a day since the dose is higher than her regular dose of 240 could be contributing for it we'll try to change her to Verapamil SR 240 mg a day and see how she does if she continues to have lower extremity edema we'll check echocardiogram.  . Chest pain 06/28/2013   Last Assessment & Plan:  Patient reports intermittent chest pain accompanied by dyspnea and anxiety.  Her pain has pleuritic component to it.  However patient is unable to exercise the past several month due to complaints of exertional dyspnea.  She tells that she feels unsafe.  Therefore will investigate with exercise echocardiogram.  . Dizziness 10/31/2016   Last Assessment & Plan:  As above.  We'll try to change her to Verapamil SR and see how she does if she continues to have problems then we will consider changing it.  . Essential hypertension 10/31/2016   Last Assessment & Plan:  Patient with known hypertension did very well with the meropenem will SR 240 mg a day.  Her primary was giving her verapamil 120 mg 3 times a day and which is not helping her blood pressure on top of that she is having a lot of side effects with the edema and dyspnea on exertion and palpitations.  She wants to get back on her regular where panel to 40 mg SR so we'll go ahe  . Hypertension   . Palpitations 10/31/2016   Last Assessment & Plan:  Patient says that palpitations are also happening since she is on short-acting meropenem L plan as about we'll change it and really reevaluate how the palpitations are.    Patient Active Problem List   Diagnosis Date Noted  . Abnormal EKG 01/15/2018  . Bilateral leg edema 10/31/2016  . Dizziness 10/31/2016  . Essential hypertension 10/31/2016  . Palpitations 10/31/2016  . Chest pain 06/28/2013    Past Surgical History:  Procedure Laterality Date  . ABDOMINAL HYSTERECTOMY    . LAPAROSCOPIC HYSTERECTOMY    . TONSILLECTOMY      Prior to Admission medications   Medication Sig Start Date End Date Taking? Authorizing Provider  meclizine (ANTIVERT) 25 MG tablet Take 1 tablet (25 mg total) by mouth 3 (three) times daily as needed for dizziness. 01/06/18   Dionne Bucy, MD  meloxicam (MOBIC) 15 MG tablet Take 1 tablet (15 mg total) by mouth daily. 05/13/18   Jazlyne Gauger, Delorise Royals,  PA-C  verapamil (CALAN-SR) 120 MG CR tablet Take 1 tablet (120 mg total) by mouth daily. 02/11/18 03/13/18  Revankar, Aundra Dubin, MD    Allergies Penicillins  Family History  Problem Relation Age of Onset  . Diabetes Mother   . Hypertension Mother   . Diabetes Father   . Hypertension Father     Social History Social History   Tobacco Use  . Smoking status: Former Smoker    Types: Cigarettes    Last attempt to quit: 10/25/2014    Years since quitting: 3.5  . Smokeless tobacco: Never Used  Substance Use Topics  . Alcohol use: Yes     Comment: occasional   . Drug use: No     Review of Systems  Constitutional: No fever/chills Eyes: No visual changes.  Cardiovascular: no chest pain. Respiratory: no cough. No SOB. Gastrointestinal: No abdominal pain.  No nausea, no vomiting. Musculoskeletal: Nontraumatic midfoot pain radiating into the toes Skin: Negative for rash, abrasions, lacerations, ecchymosis. Neurological: Negative for headaches, focal weakness or numbness. 10-point ROS otherwise negative.  ____________________________________________   PHYSICAL EXAM:  VITAL SIGNS: ED Triage Vitals  Enc Vitals Group     BP 05/13/18 2009 (!) 146/96     Pulse Rate 05/13/18 2009 89     Resp 05/13/18 2009 16     Temp 05/13/18 2009 97.8 F (36.6 C)     Temp Source 05/13/18 2009 Oral     SpO2 05/13/18 2009 98 %     Weight 05/13/18 1953 220 lb (99.8 kg)     Height 05/13/18 1953 5\' 4"  (1.626 m)     Head Circumference --      Peak Flow --      Pain Score 05/13/18 1953 7     Pain Loc --      Pain Edu? --      Excl. in GC? --      Constitutional: Alert and oriented. Well appearing and in no acute distress. Eyes: Conjunctivae are normal. PERRL. EOMI. Head: Atraumatic. Neck: No stridor.    Cardiovascular: Normal rate, regular rhythm. Normal S1 and S2.  Good peripheral circulation. Respiratory: Normal respiratory effort without tachypnea or retractions. Lungs CTAB. Good air entry to the bases with no decreased or absent breath sounds. Musculoskeletal: Full range of motion to all extremities. No gross deformities appreciated.  Visualization of the left foot reveals mild pedal edema.  This is consistent with either foot with no significant appreciable difference.  No erythema.  No ecchymosis.  No visible abrasion or laceration.  Patient is tender to palpation directly between the third and fourth metatarsal.  No palpable abnormality.  No other tenderness to palpation over the osseous structures of the foot.  Tenderness  occurred with palpation over the dorsal aspect and much less with plantar palpation.  Sensation intact all 5 digits.  Capillary refill intact all digits. Neurologic:  Normal speech and language. No gross focal neurologic deficits are appreciated.  Skin:  Skin is warm, dry and intact. No rash noted. Psychiatric: Mood and affect are normal. Speech and behavior are normal. Patient exhibits appropriate insight and judgement.   ____________________________________________   LABS (all labs ordered are listed, but only abnormal results are displayed)  Labs Reviewed - No data to display ____________________________________________  EKG   ____________________________________________  RADIOLOGY I personally viewed and evaluated these images as part of my medical decision making, as well as reviewing the written report by the radiologist.  Dg Foot Complete Left  Result Date: 05/13/2018 CLINICAL DATA:  Foot pain EXAM: LEFT FOOT - COMPLETE 3+ VIEW COMPARISON:  None. FINDINGS: There is no evidence of fracture or dislocation. There is no evidence of arthropathy or other focal bone abnormality. Dorsal soft tissue swelling without soft tissue emphysema or radiopaque foreign body. IMPRESSION: No acute osseous abnormality Electronically Signed   By: Jasmine Pang M.D.   On: 05/13/2018 20:38    ____________________________________________    PROCEDURES  Procedure(s) performed:    Procedures    Medications - No data to display   ____________________________________________   INITIAL IMPRESSION / ASSESSMENT AND PLAN / ED COURSE  Pertinent labs & imaging results that were available during my care of the patient were reviewed by me and considered in my medical decision making (see chart for details).  Review of the [place] CSRS was performed in accordance of the [facility] prior to dispensing any controlled drugs.      Patient's diagnosis is consistent with Morton's neuroma.  Patient presented to  the emergency department with midfoot pain radiating into the toes.  On exam, patient was tender to palpation between the third and fourth metatarsal.  No history of injury.  Patient does have pedal edema but no change from baseline.  Differential included cellulitis, fracture, stress fracture, foot strain, Morton's neuroma, metatarsal bursitis.. Patient will be discharged home with prescriptions for meloxicam. Patient is to follow up with podiatry as needed or otherwise directed. Patient is given ED precautions to return to the ED for any worsening or new symptoms.     ____________________________________________  FINAL CLINICAL IMPRESSION(S) / ED DIAGNOSES  Final diagnoses:  Morton's metatarsalgia, left      NEW MEDICATIONS STARTED DURING THIS VISIT:  ED Discharge Orders         Ordered    meloxicam (MOBIC) 15 MG tablet  Daily     05/13/18 2113              This chart was dictated using voice recognition software/Dragon. Despite best efforts to proofread, errors can occur which can change the meaning. Any change was purely unintentional.    Racheal Patches, PA-C 05/13/18 2125    Jeanmarie Plant, MD 05/13/18 2237

## 2018-05-29 IMAGING — US US EXTREM LOW VENOUS*R*
1 series · 13 of 24 positions shown · non-contrast
Comparison: None.

CLINICAL DATA: Right lower extremity pain and swelling, initial
encounter



[Series 1: us extrem low venous*right* · 0.08mm/px · 13 of 34 slices shown]
[im 1/34]
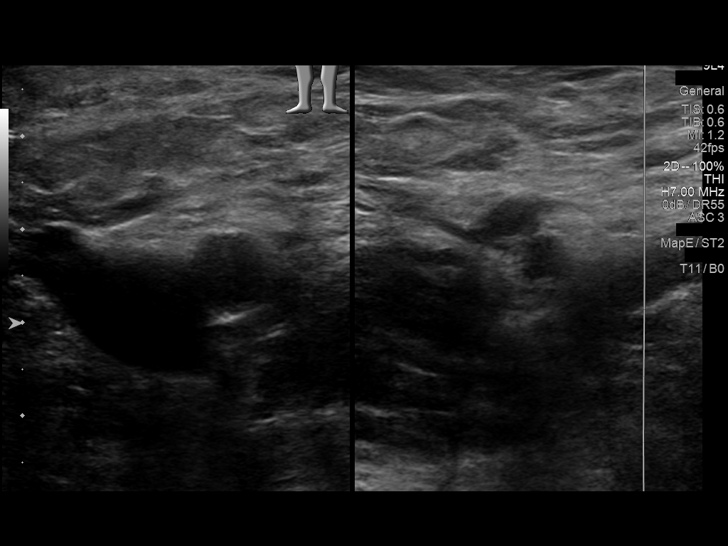
[im 3/34]
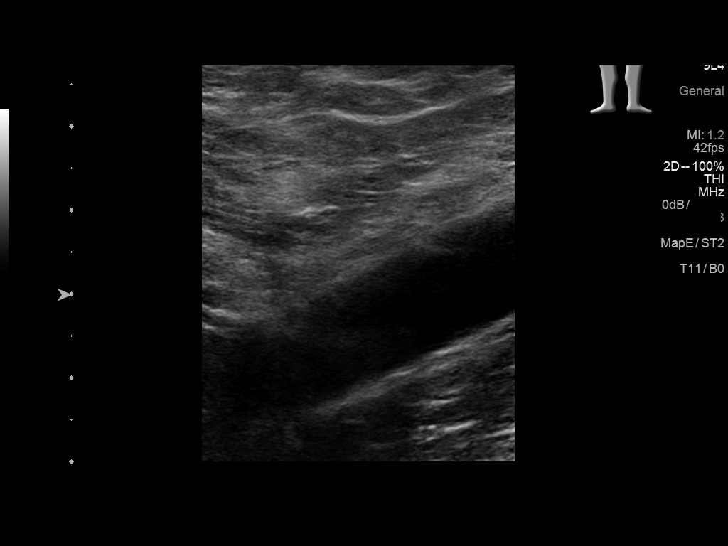
[im 6/34]
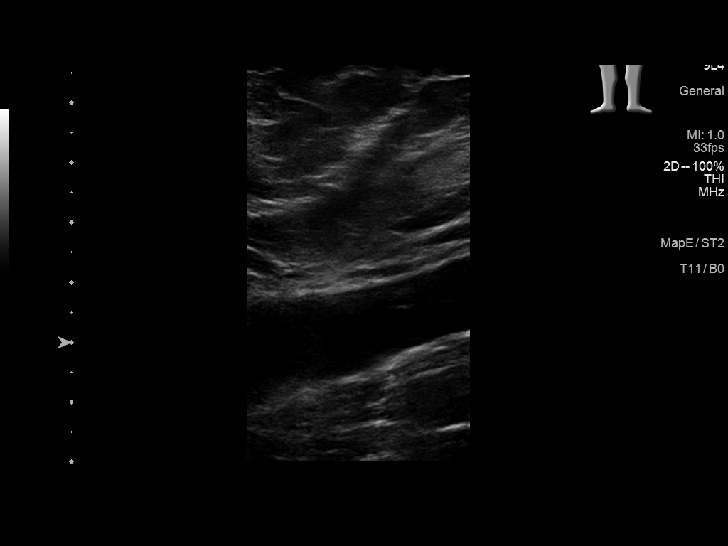
[im 9/34]
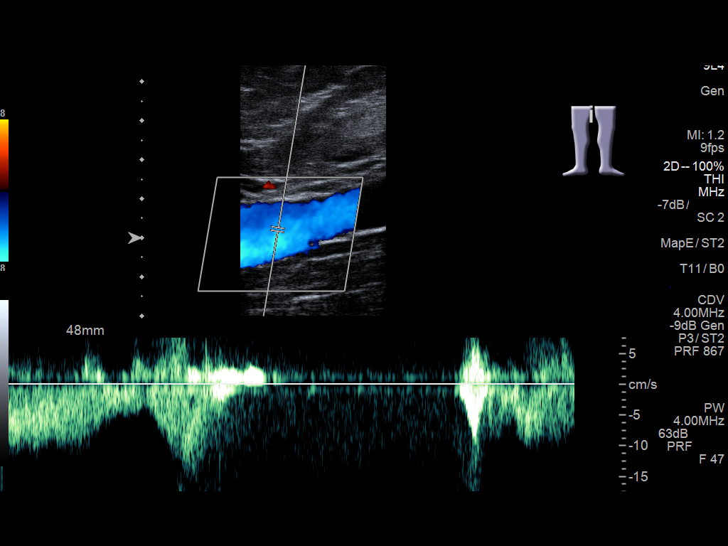
[im 12/34]
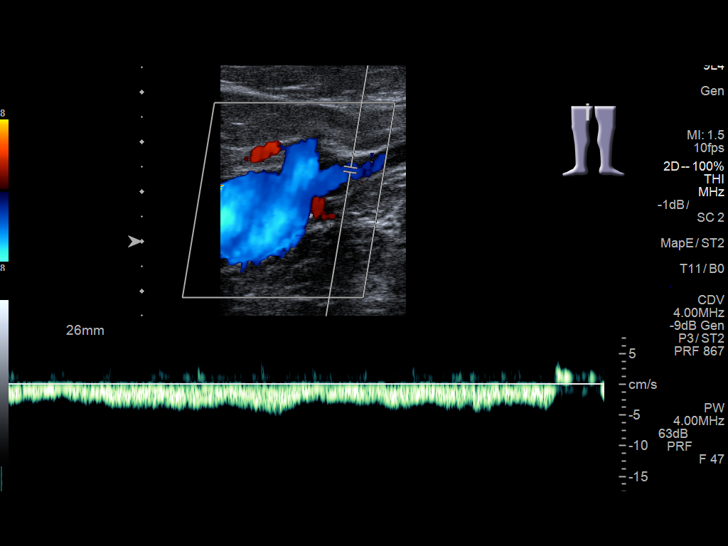
[im 15/34]
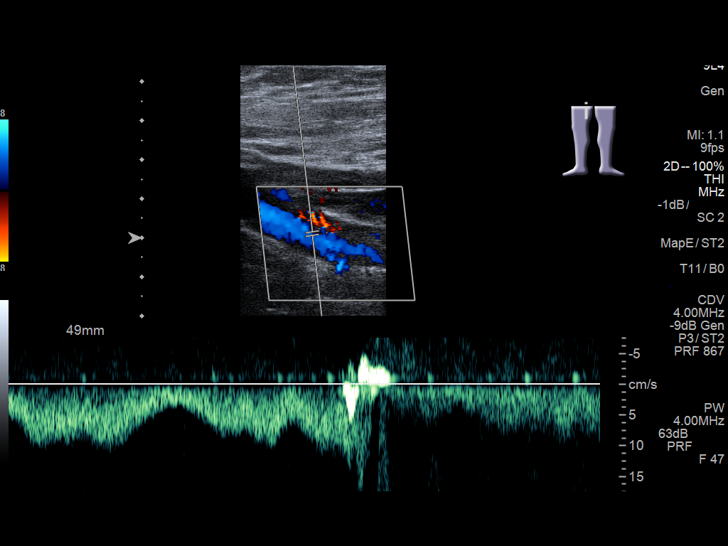
[im 18/34]
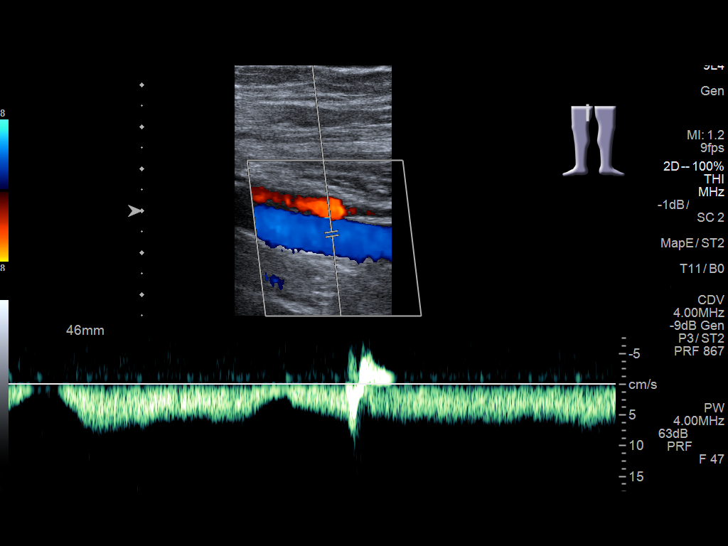
[im 19/34]
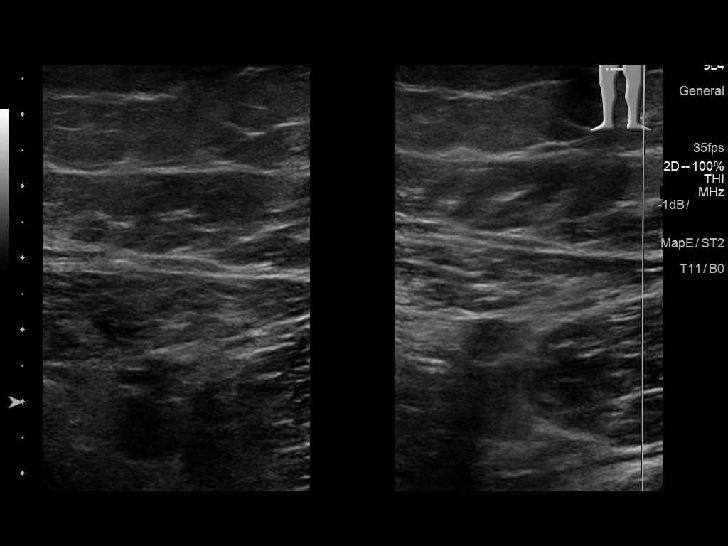
[im 22/34]
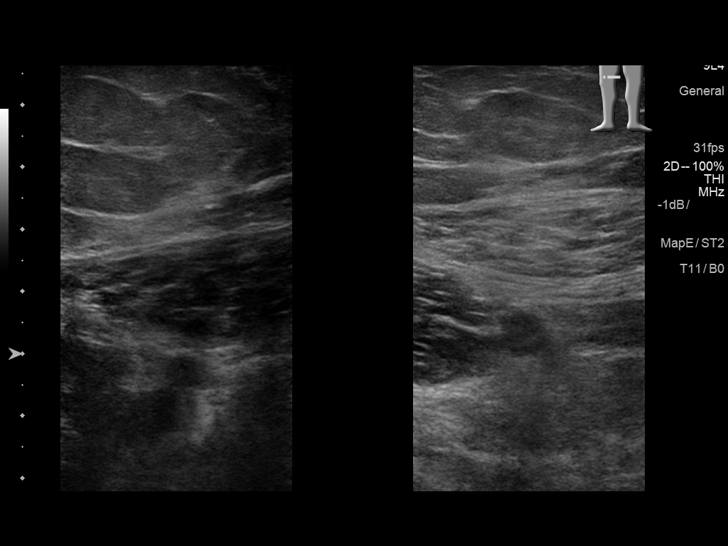
[im 25/34]
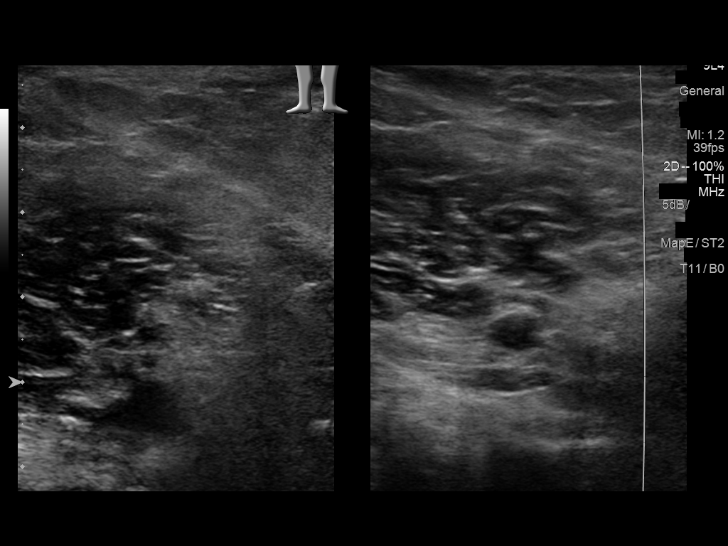
[im 28/34]
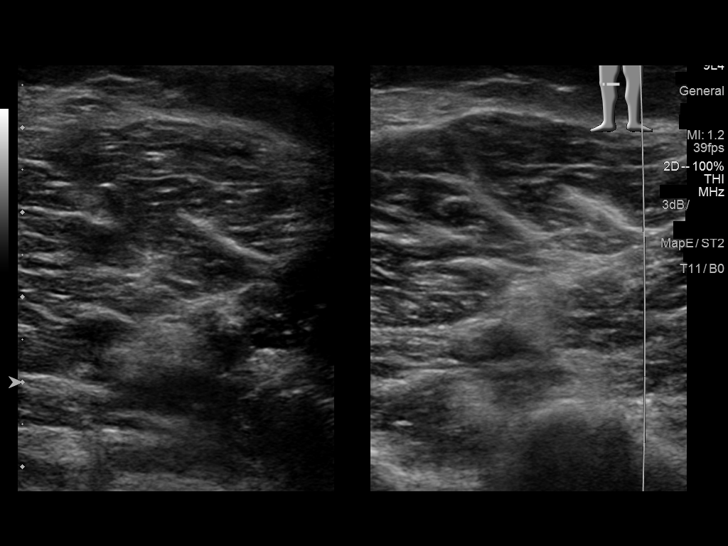
[im 31/34]
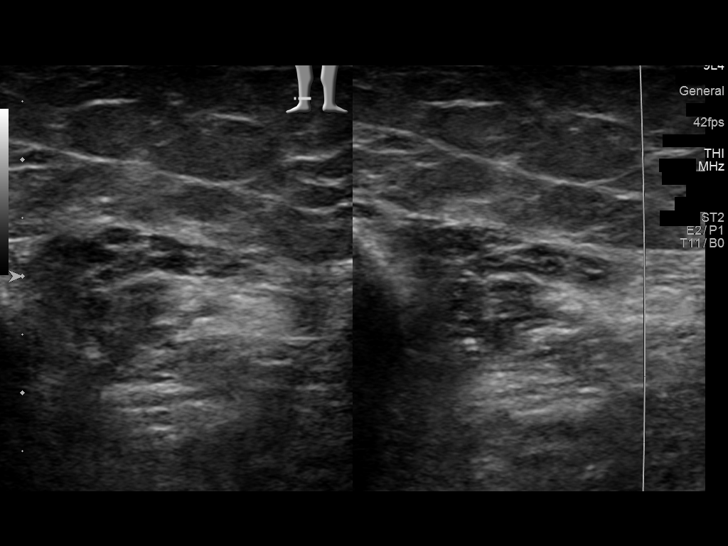
[im 34/34]
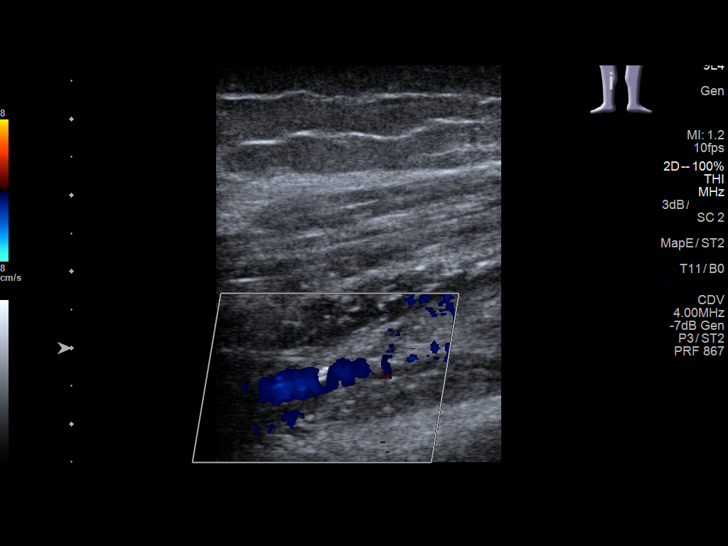

[13 of 24 positions shown; findings below may reference images not displayed]

FINDINGS: Contralateral Common Femoral Vein: Respiratory phasicity is normal
and symmetric with the symptomatic side. No evidence of thrombus.
Normal compressibility.

Common Femoral Vein: No evidence of thrombus. Normal
compressibility, respiratory phasicity and response to augmentation.

Saphenofemoral Junction: No evidence of thrombus. Normal
compressibility and flow on color Doppler imaging.

Profunda Femoral Vein: No evidence of thrombus. Normal
compressibility and flow on color Doppler imaging.

Femoral Vein: No evidence of thrombus. Normal compressibility,
respiratory phasicity and response to augmentation.

Popliteal Vein: No evidence of thrombus. Normal compressibility,
respiratory phasicity and response to augmentation.

Calf Veins: No evidence of thrombus. Normal compressibility and flow
on color Doppler imaging.

Superficial Great Saphenous Vein: No evidence of thrombus. Normal
compressibility and flow on color Doppler imaging.

Venous Reflux:  None.

Other Findings:  None.
IMPRESSION: No evidence of deep venous thrombosis.

## 2018-06-27 ENCOUNTER — Emergency Department: Payer: Self-pay

## 2018-06-27 ENCOUNTER — Encounter: Payer: Self-pay | Admitting: Emergency Medicine

## 2018-06-27 ENCOUNTER — Emergency Department
Admission: EM | Admit: 2018-06-27 | Discharge: 2018-06-27 | Disposition: A | Discharge status: Home / Self Care | Payer: Self-pay | Attending: Emergency Medicine | Admitting: Emergency Medicine

## 2018-06-27 ENCOUNTER — Other Ambulatory Visit: Payer: Self-pay

## 2018-06-27 DIAGNOSIS — W230XXA Caught, crushed, jammed, or pinched between moving objects, initial encounter: Secondary | ICD-10-CM | POA: Insufficient documentation

## 2018-06-27 DIAGNOSIS — M25471 Effusion, right ankle: Secondary | ICD-10-CM

## 2018-06-27 DIAGNOSIS — Y9301 Activity, walking, marching and hiking: Secondary | ICD-10-CM | POA: Insufficient documentation

## 2018-06-27 DIAGNOSIS — F17200 Nicotine dependence, unspecified, uncomplicated: Secondary | ICD-10-CM | POA: Insufficient documentation

## 2018-06-27 DIAGNOSIS — I10 Essential (primary) hypertension: Secondary | ICD-10-CM | POA: Insufficient documentation

## 2018-06-27 DIAGNOSIS — M25571 Pain in right ankle and joints of right foot: Secondary | ICD-10-CM

## 2018-06-27 DIAGNOSIS — Y929 Unspecified place or not applicable: Secondary | ICD-10-CM | POA: Insufficient documentation

## 2018-06-27 DIAGNOSIS — W19XXXA Unspecified fall, initial encounter: Secondary | ICD-10-CM

## 2018-06-27 DIAGNOSIS — S90511A Abrasion, right ankle, initial encounter: Secondary | ICD-10-CM | POA: Insufficient documentation

## 2018-06-27 DIAGNOSIS — M79671 Pain in right foot: Secondary | ICD-10-CM | POA: Insufficient documentation

## 2018-06-27 DIAGNOSIS — Y999 Unspecified external cause status: Secondary | ICD-10-CM | POA: Insufficient documentation

## 2018-06-27 MED ORDER — NAPROXEN 375 MG PO TABS: 375.0000 mg | ORAL_TABLET | Freq: Two times a day (BID) | ORAL | 0 refills | Status: DC

## 2018-06-27 NOTE — ED Provider Notes (Signed)
[facility] Emergency Department Provider Note ____________________________________________  Time seen: 1355  I have reviewed the triage vital signs and the nursing notes.  HISTORY  Chief Complaint  Ankle Pain   HPI Christine Parks is a 37 y.o. female presents to the ER today with complaint of right foot and ankle pain and swelling.  She reports an injury approximately 1 hour PTA.  She reports she was hiking with a friend, was standing on a log, jumped off and her right foot got caught in between 2 logs.  She was able to ambulate immediately after the accident.  She describes the pain as sharp, throbbing and burning.  She has an abrasion to her anterior ankle.  She has noticed swelling.  She denies numbness, tingling or weakness.  She did not take anything over-the-counter for this prior to arrival.  Past Medical History:  Diagnosis Date  . Bilateral leg edema 10/31/2016   Last Assessment & Plan:  Patient does have bilateral lower extremity edema she says that is coming from the verapamil 120 3 times a day since the dose is higher than her regular dose of 240 could be contributing for it we'll try to change her to Verapamil SR 240 mg a day and see how she does if she continues to have lower extremity edema we'll check echocardiogram.  . Chest pain 06/28/2013   Last Assessment & Plan:  Patient reports intermittent chest pain accompanied by dyspnea and anxiety.  Her pain has pleuritic component to it.  However patient is unable to exercise the past several month due to complaints of exertional dyspnea.  She tells that she feels unsafe.  Therefore will investigate with exercise echocardiogram.  . Dizziness 10/31/2016   Last Assessment & Plan:  As above.  We'll try to change her to Verapamil SR and see how she does if she continues to have problems then we will consider changing it.  . Essential hypertension 10/31/2016   Last Assessment & Plan:  Patient with known  hypertension did very well with the meropenem will SR 240 mg a day.  Her primary was giving her verapamil 120 mg 3 times a day and which is not helping her blood pressure on top of that she is having a lot of side effects with the edema and dyspnea on exertion and palpitations.  She wants to get back on her regular where panel to 40 mg SR so we'll go ahe  . Hypertension   . Palpitations 10/31/2016   Last Assessment & Plan:  Patient says that palpitations are also happening since she is on short-acting meropenem L plan as about we'll change it and really reevaluate how the palpitations are.    Patient Active Problem List   Diagnosis Date Noted  . Abnormal EKG 01/15/2018  . Bilateral leg edema 10/31/2016  . Dizziness 10/31/2016  . Essential hypertension 10/31/2016  . Palpitations 10/31/2016  . Chest pain 06/28/2013    Past Surgical History:  Procedure Laterality Date  . ABDOMINAL HYSTERECTOMY    . LAPAROSCOPIC HYSTERECTOMY    . TONSILLECTOMY      Prior to Admission medications   Medication Sig Start Date End Date Taking? Authorizing Provider  meclizine (ANTIVERT) 25 MG tablet Take 1 tablet (25 mg total) by mouth 3 (three) times daily as needed for dizziness. 01/06/18   Dionne Bucy, MD  naproxen (NAPROSYN) 375 MG tablet Take 1 tablet (375 mg total) by mouth 2 (two) times daily with a meal. 06/27/18  Lorre MunroeBaity, Regina W, NP  verapamil (CALAN-SR) 120 MG CR tablet Take 1 tablet (120 mg total) by mouth daily. 02/11/18 03/13/18  Revankar, Aundra Dubinajan R, MD    Allergies Penicillins  Family History  Problem Relation Age of Onset  . Diabetes Mother   . Hypertension Mother   . Diabetes Father   . Hypertension Father     Social History Social History   Tobacco Use  . Smoking status: Former Smoker    Types: Cigarettes    Last attempt to quit: 10/25/2014    Years since quitting: 3.6  . Smokeless tobacco: Never Used  Substance Use Topics  . Alcohol use: Yes    Comment: occasional   .  Drug use: No    Review of Systems  Constitutional: Negative for fever. Musculoskeletal: Positive for right ankle 4/foot pain and swelling. Skin: Positive for abrasion to right anterior ankle.Marland Kitchen. Neurological: Negative for tingling, focal weakness or numbness. ____________________________________________  PHYSICAL EXAM:  VITAL SIGNS: ED Triage Vitals  Enc Vitals Group     BP 06/27/18 1346 (!) 129/93     Pulse Rate 06/27/18 1346 (!) 108     Resp 06/27/18 1346 18     Temp 06/27/18 1346 98.1 F (36.7 C)     Temp Source 06/27/18 1346 Oral     SpO2 06/27/18 1346 100 %     Weight 06/27/18 1347 220 lb (99.8 kg)     Height 06/27/18 1347 5\' 4"  (1.626 m)     Head Circumference --      Peak Flow --      Pain Score 06/27/18 1347 10     Pain Loc --      Pain Edu? --      Excl. in GC? --     Constitutional: Alert and oriented.  Obese, in no distress. Cardiovascular: Normal rate, regular rhythm.  Pedal pulses 2+ bilaterally. Respiratory: Normal respiratory effort. No wheezes/rales/rhonchi. Musculoskeletal: Normal dorsiflexion and plantarflexion of the right foot.  Normal rotation of the right ankle.  Pain with palpation inferior to the right lateral malleolus.  1+ swelling noted over the right lateral malleolus.  Pain with palpation of the proximal third fourth and fifth metatarsals.  Pain with resistance to dorsiflexion.  Gait not visualized. Neurologic: Sensation intact to BLE. Skin: Small abrasion noted over anterior ankle.  Small amount of bloody drainage noted. ____________________________________________  RADIOLOGY  Imaging Orders     DG Ankle Complete Right     DG Foot Complete Right IMPRESSION: No fracture.  IMPRESSION: 1. No fracture or dislocation. 2. Calcaneal spurs. ___________________________________________   INITIAL IMPRESSION / ASSESSMENT AND PLAN / ED COURSE  Right Ankle Pain and Swelling s/p Fall:  Xray of right foot/ankle negative for acute fracture ACE  wrap applied Advised ice for 10 min 2 x day Encouraged elevation eRx for Naproxen 375 mg BID x 7 days ____________________________________________  FINAL CLINICAL IMPRESSION(S) / ED DIAGNOSES  Final diagnoses:  Acute right ankle pain  Right ankle swelling  Right foot pain  Fall, initial encounter  Abrasion, right ankle, initial encounter   Nicki Reaperegina Baity, NP    Lorre MunroeBaity, Regina W, NP 06/27/18 1434    Myrna BlazerSchaevitz, David Matthew, MD 06/27/18 [id number]

## 2018-06-27 NOTE — ED Notes (Signed)
See triage note  States she missed stepped  Fell   Twisted right ankle  Abrasions to foot  Good pulses

## 2018-06-27 NOTE — Discharge Instructions (Addendum)
You have been diagnosed with right foot/ankle pain and swelling.  X-rays are negative for acute fracture.  We have wrapped her ankle with an Ace wrap.  Wear this during the day, with ambulation and take off during the night.  We encourage elevation, ice for 10 minutes twice a day to help reduce swelling and inflammation.  I have given you a prescription for anti-inflammatories to take twice a day with food for pain and swelling.  Follow-up as needed if symptoms persist or worsen.

## 2018-06-27 NOTE — ED Triage Notes (Signed)
Fell approx 1 hour ago. Pain R ankle. Abrasion noted.

## 2018-08-18 ENCOUNTER — Other Ambulatory Visit (HOSPITAL_COMMUNITY)
Admission: RE | Admit: 2018-08-18 | Discharge: 2018-08-18 | Disposition: A | Discharge status: Home / Self Care | Payer: BLUE CROSS/BLUE SHIELD | Source: Ambulatory Visit | Attending: Obstetrics and Gynecology | Admitting: Obstetrics and Gynecology

## 2018-08-18 ENCOUNTER — Encounter: Payer: Self-pay | Admitting: Obstetrics and Gynecology

## 2018-08-18 ENCOUNTER — Ambulatory Visit (INDEPENDENT_AMBULATORY_CARE_PROVIDER_SITE_OTHER): Payer: BLUE CROSS/BLUE SHIELD | Admitting: Obstetrics and Gynecology

## 2018-08-18 DIAGNOSIS — Z01419 Encounter for gynecological examination (general) (routine) without abnormal findings: Secondary | ICD-10-CM | POA: Insufficient documentation

## 2018-08-18 DIAGNOSIS — Z202 Contact with and (suspected) exposure to infections with a predominantly sexual mode of transmission: Secondary | ICD-10-CM

## 2018-08-18 DIAGNOSIS — N898 Other specified noninflammatory disorders of vagina: Secondary | ICD-10-CM

## 2018-08-18 HISTORY — DX: Other specified noninflammatory disorders of vagina: N89.8

## 2018-08-18 HISTORY — DX: Contact with and (suspected) exposure to infections with a predominantly sexual mode of transmission: Z20.2

## 2018-08-18 HISTORY — DX: Encounter for gynecological examination (general) (routine) without abnormal findings: Z01.419

## 2018-08-18 MED ORDER — OSPEMIFENE 60 MG PO TABS: 1.0000 | ORAL_TABLET | Freq: Every day | ORAL | 11 refills | Status: DC

## 2018-08-18 NOTE — Progress Notes (Signed)
Christine Parks is a 38 y.o. 684P0 female here for a routine annual gynecologic exam.  S/P TAH 3 yrs ago for fibroids. She reports some decreased sex drive and vaginal dryness. Has used Vagifem in the past which helped. But stopped d/t to cost.     Medical problems and medications as listed   Gynecologic History No LMP recorded. Patient has had a hysterectomy. Last Pap: 2019. Results were: normal Last mammogram: NA. Results were: NA  Obstetric History OB History  Gravida Para Term Preterm AB Living  4         4  SAB TAB Ectopic Multiple Live Births          4    # Outcome Date GA Lbr Len/2nd Weight Sex Delivery Anes PTL Lv  4 Gravida           3 Gravida           2 Gravida           1 [place]Gravida             Past Medical History:  Diagnosis Date  . Bilateral leg edema 10/31/2016   Last Assessment & Plan:  Patient does have bilateral lower extremity edema she says that is coming from the verapamil 120 3 times a day since the dose is higher than her regular dose of 240 could be contributing for it we'll try to change her to Verapamil SR 240 mg a day and see how she does if she continues to have lower extremity edema we'll check echocardiogram.  . Chest pain 06/28/2013   Last Assessment & Plan:  Patient reports intermittent chest pain accompanied by dyspnea and anxiety.  Her pain has pleuritic component to it.  However patient is unable to exercise the past several month due to complaints of exertional dyspnea.  She tells that she feels unsafe.  Therefore will investigate with exercise echocardiogram.  . Dizziness 10/31/2016   Last Assessment & Plan:  As above.  We'll try to change her to Verapamil SR and see how she does if she continues to have problems then we will consider changing it.  . Essential hypertension 10/31/2016   Last Assessment & Plan:  Patient with known hypertension did very well with the meropenem will SR 240 mg a day.  Her primary was giving her verapamil 120 mg 3 times  a day and which is not helping her blood pressure on top of that she is having a lot of side effects with the edema and dyspnea on exertion and palpitations.  She wants to get back on her regular where panel to 40 mg SR so we'll go ahe  . Hypertension   . Palpitations 10/31/2016   Last Assessment & Plan:  Patient says that palpitations are also happening since she is on short-acting meropenem L plan as about we'll change it and really reevaluate how the palpitations are.    Past Surgical History:  Procedure Laterality Date  . ABDOMINAL HYSTERECTOMY    . CESAREAN SECTION  11/26/2001  . CESAREAN SECTION  06/17/2004  . LAPAROSCOPIC HYSTERECTOMY    . TONSILLECTOMY      Current Outpatient Medications on File Prior to Visit  Medication Sig Dispense Refill  . verapamil (CALAN-SR) 120 MG CR tablet Take 1 tablet (120 mg total) by mouth daily. 90 tablet 3   No current facility-administered medications on file prior to visit.     Allergies  Allergen Reactions  . Penicillins Rash  Has patient had a PCN reaction causing immediate rash, facial/tongue/throat swelling, SOB or lightheadedness with hypotension: Unknown Has patient had a PCN reaction causing severe rash involving mucus membranes or skin necrosis: Unknown Has patient had a PCN reaction that required hospitalization: Unknown Has patient had a PCN reaction occurring within the last 10 years: Unknown If all of the above answers are "NO", then may proceed with Cephalosporin use.     Social History   Socioeconomic History  . Marital status: Married    Spouse name: Not on file  . Number of children: Not on file  . Years of education: Not on file  . Highest education level: Not on file  Occupational History  . Not on file  Social Needs  . Financial resource strain: Not on file  . Food insecurity:    Worry: Not on file    Inability: Not on file  . Transportation needs:    Medical: Not on file    Non-medical: Not on file   Tobacco Use  . Smoking status: Former Smoker    Types: Cigarettes    Last attempt to quit: 10/25/2014    Years since quitting: 3.8  . Smokeless tobacco: Never Used  Substance and Sexual Activity  . Alcohol use: Yes    Comment: occasional   . Drug use: No  . Sexual activity: Yes    Birth control/protection: Surgical  Lifestyle  . Physical activity:    Days per week: Not on file    Minutes per session: Not on file  . Stress: Not on file  Relationships  . Social connections:    Talks on phone: Not on file    Gets together: Not on file    Attends religious service: Not on file    Active member of club or organization: Not on file    Attends meetings of clubs or organizations: Not on file    Relationship status: Not on file  . Intimate partner violence:    Fear of current or ex partner: Not on file    Emotionally abused: Not on file    Physically abused: Not on file    Forced sexual activity: Not on file  Other Topics Concern  . Not on file  Social History Narrative  . Not on file    Family History  Problem Relation Age of Onset  . Diabetes Mother   . Hypertension Mother   . Diabetes Father   . Hypertension Father     The following portions of the patient's history were reviewed and updated as appropriate: allergies, current medications, past family history, past medical history, past social history, past surgical history and problem list.  Review of Systems Pertinent items noted in HPI and remainder of comprehensive ROS otherwise negative.   Objective:  BP 132/83   Pulse (!) 101   Ht 5\' 4"  (1.626 m)   Wt 267 lb 3.2 oz (121.2 kg)   BMI 45.86 kg/m  CONSTITUTIONAL: Well-developed, well-nourished female in no acute distress.  HENT:  Normocephalic, atraumatic, External right and left ear normal. Oropharynx is clear and moist EYES: Conjunctivae and EOM are normal. Pupils are equal, round, and reactive to light. No scleral icterus.  NECK: Normal range of motion,  supple, no masses.  Normal thyroid.  SKIN: Skin is warm and dry. No rash noted. Not diaphoretic. No erythema. No pallor. NEUROLGIC: Alert and oriented to person, place, and time. Normal reflexes, muscle tone coordination. No cranial nerve deficit noted. PSYCHIATRIC: Normal mood  and affect. Normal behavior. Normal judgment and thought content. CARDIOVASCULAR: Normal heart rate noted, regular rhythm RESPIRATORY: Clear to auscultation bilaterally. Effort and breath sounds normal, no problems with respiration noted. BREASTS: Symmetric in size. No masses, skin changes, nipple drainage, or lymphadenopathy. ABDOMEN: Soft, normal bowel sounds, no distention noted.  No tenderness, rebound or guarding.  PELVIC: Normal appearing external genitalia; vaginal mucosa slightly atrophic  No abnormal discharge noted.  Pap smear obtained from vaginal cuff.  Uterus absent no adnexal tenderness. MUSCULOSKELETAL: Normal range of motion. No tenderness.  No cyanosis, clubbing, or edema.  2+ distal pulses.   Assessment:  Annual gynecologic examination with pap smear  STD exposure Vaginal dryness Plan:  Will follow up results of pap smear and manage accordingly. [place] for vaginal dryness. U/R/B reviewed F/U in 3 months to evaluate response Routine preventative health maintenance measures emphasized. Please refer to After Visit Summary for other counseling recommendations.    Hermina StaggersMichael L Ervin, MD, FACOG Attending Obstetrician & Gynecologist Center for [facility], [facility]

## 2018-08-18 NOTE — Patient Instructions (Signed)

## 2018-08-18 NOTE — Progress Notes (Signed)
NGYN pt presents for annual and vaginal STD testing. Pt c/o vaginal dryness and low libido since Hyst 3 yrs ago.

## 2018-08-18 NOTE — Addendum Note (Signed)
Addended by: Dalphine Handing on: 08/18/2018 11:45 AM   Modules accepted: Orders

## 2018-08-24 LAB — CYTOLOGY - PAP
Bacterial vaginitis: POSITIVE — AB
Candida vaginitis: NEGATIVE
Chlamydia: NEGATIVE
Diagnosis: NEGATIVE
HPV (WINDOPATH): DETECTED — AB
HPV 16/18/45 genotyping: NEGATIVE
Neisseria Gonorrhea: NEGATIVE
Trichomonas: POSITIVE — AB

## 2018-08-31 ENCOUNTER — Other Ambulatory Visit: Payer: Self-pay

## 2018-08-31 ENCOUNTER — Telehealth: Payer: Self-pay

## 2018-08-31 DIAGNOSIS — N760 Acute vaginitis: Principal | ICD-10-CM

## 2018-08-31 DIAGNOSIS — B9689 Other specified bacterial agents as the cause of diseases classified elsewhere: Secondary | ICD-10-CM

## 2018-08-31 DIAGNOSIS — A599 Trichomoniasis, unspecified: Secondary | ICD-10-CM

## 2018-08-31 MED ORDER — METRONIDAZOLE 500 MG PO TABS: 500.0000 mg | ORAL_TABLET | Freq: Two times a day (BID) | ORAL | 0 refills | Status: AC

## 2018-08-31 NOTE — Telephone Encounter (Signed)
Pt informed of results. Rx sent to pharmacy. Pt advised to abstain from IC until she and her partner has completed treatment, and her TOC has been completed

## 2018-10-09 ENCOUNTER — Ambulatory Visit: Payer: Medicaid Other | Admitting: Obstetrics and Gynecology

## 2018-10-12 ENCOUNTER — Encounter: Payer: Self-pay | Admitting: Obstetrics and Gynecology

## 2018-10-12 ENCOUNTER — Telehealth: Payer: Self-pay | Admitting: Obstetrics and Gynecology

## 2018-10-12 NOTE — Telephone Encounter (Signed)
LVM to CB and reschedule missed appointment.  Mailed letter to call office and reschedule appointment.

## 2019-06-30 ENCOUNTER — Ambulatory Visit: Payer: Self-pay | Admitting: Cardiology

## 2019-08-23 DIAGNOSIS — Z20828 Contact with and (suspected) exposure to other viral communicable diseases: Secondary | ICD-10-CM | POA: Diagnosis not present

## 2019-08-23 DIAGNOSIS — U071 COVID-19: Secondary | ICD-10-CM | POA: Diagnosis not present

## 2019-09-06 DIAGNOSIS — U071 COVID-19: Secondary | ICD-10-CM | POA: Diagnosis not present

## 2019-09-06 DIAGNOSIS — Z20828 Contact with and (suspected) exposure to other viral communicable diseases: Secondary | ICD-10-CM | POA: Diagnosis not present

## 2019-10-05 IMAGING — DX DG FOOT COMPLETE 3+V*R*
3 series · 3 of 3 positions shown · non-contrast
Comparison: Right ankle radiographs obtained at the same time.

CLINICAL DATA: Lateral right foot and ankle pain following a
twisting injury today.

EXAM:
RIGHT FOOT COMPLETE - 3+ VIEW

[foot ap]
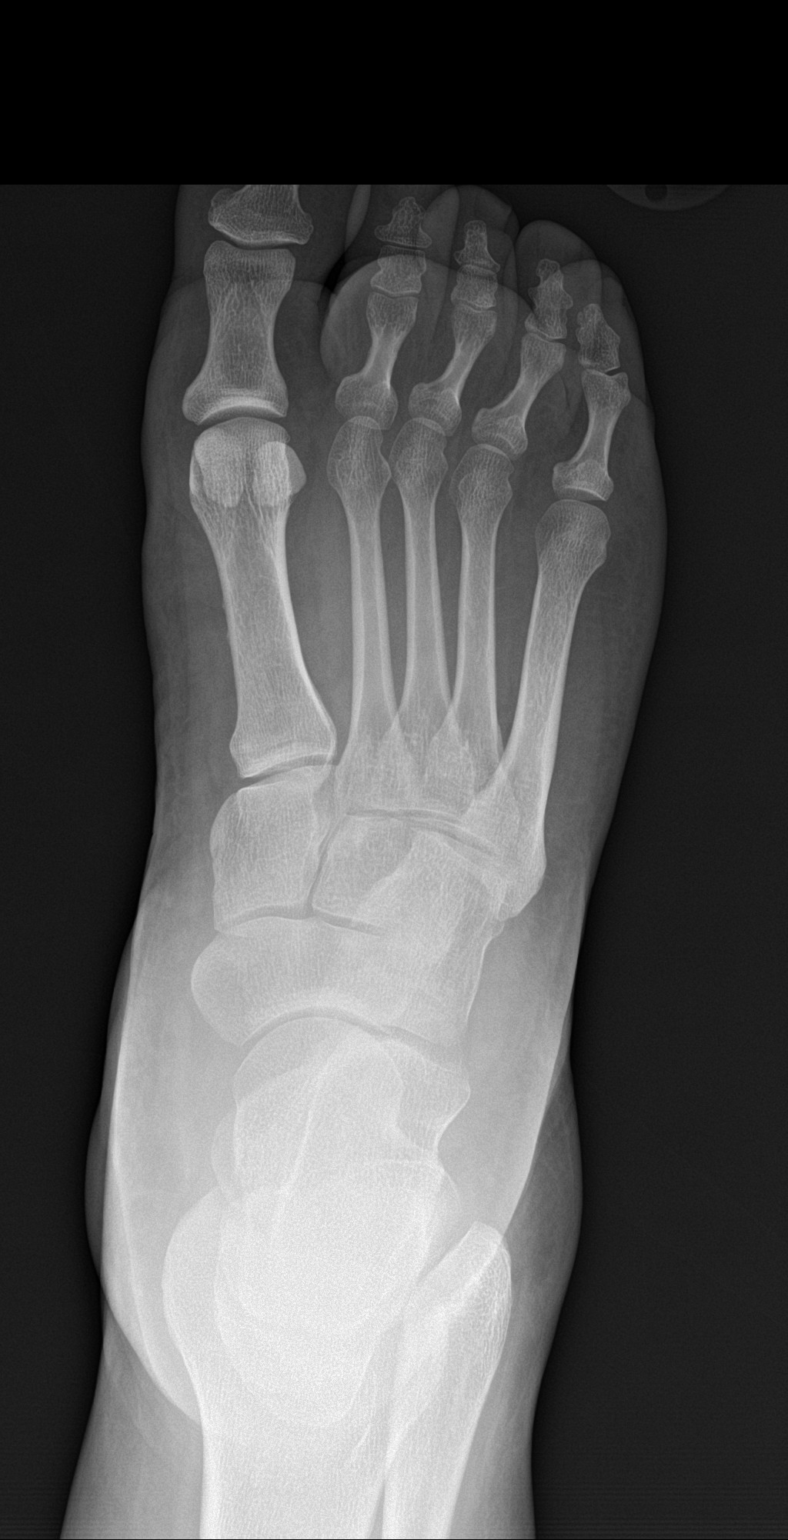

[foot obl]
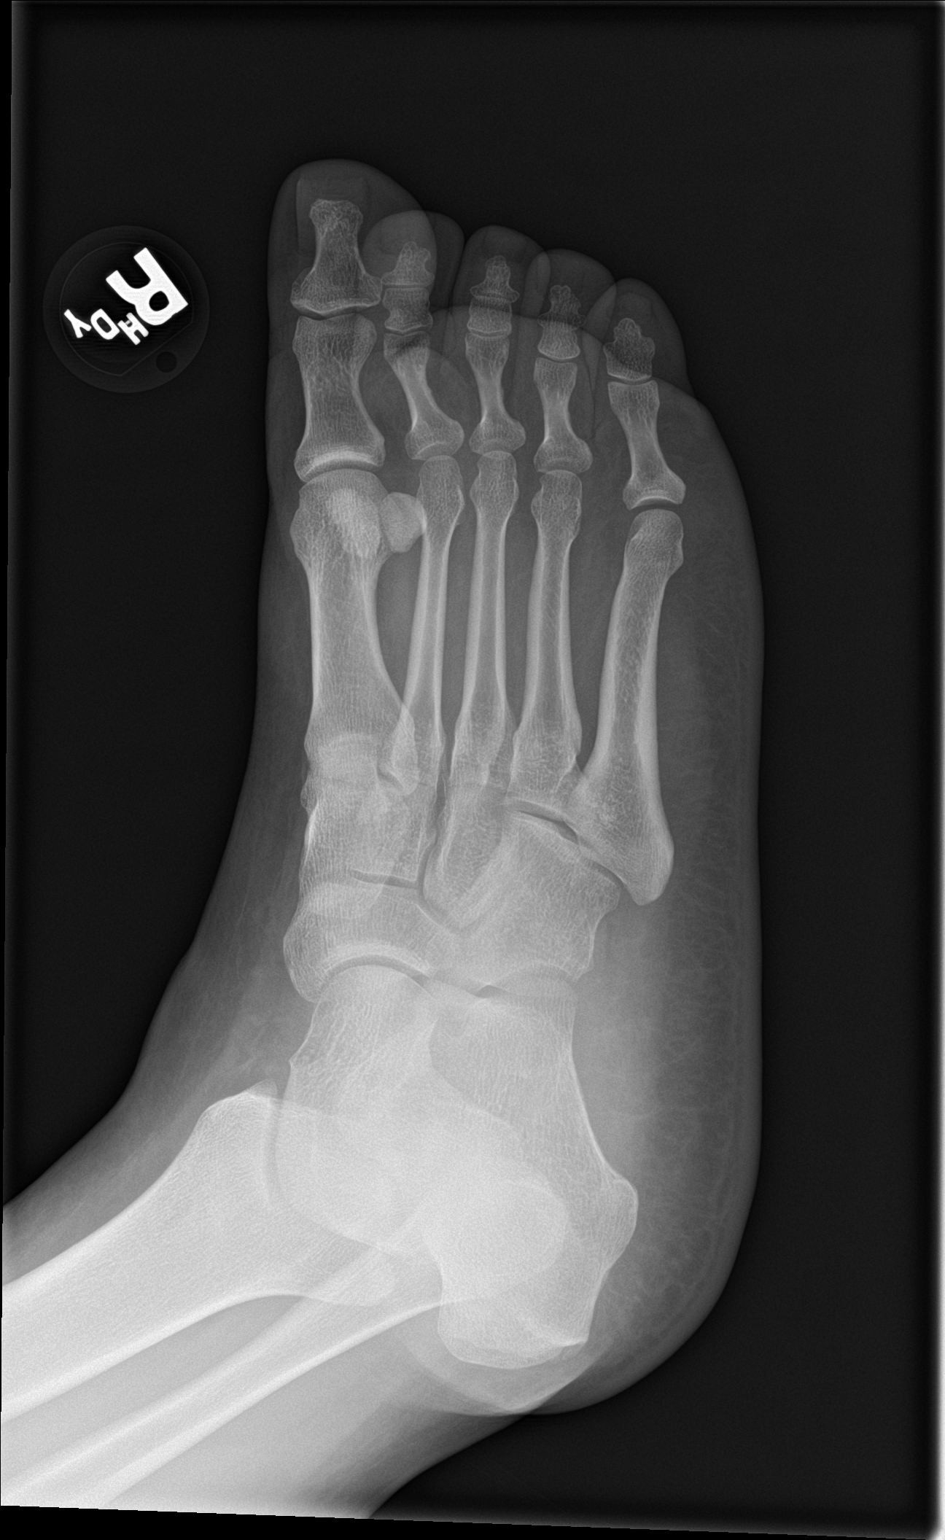

[foot lat]
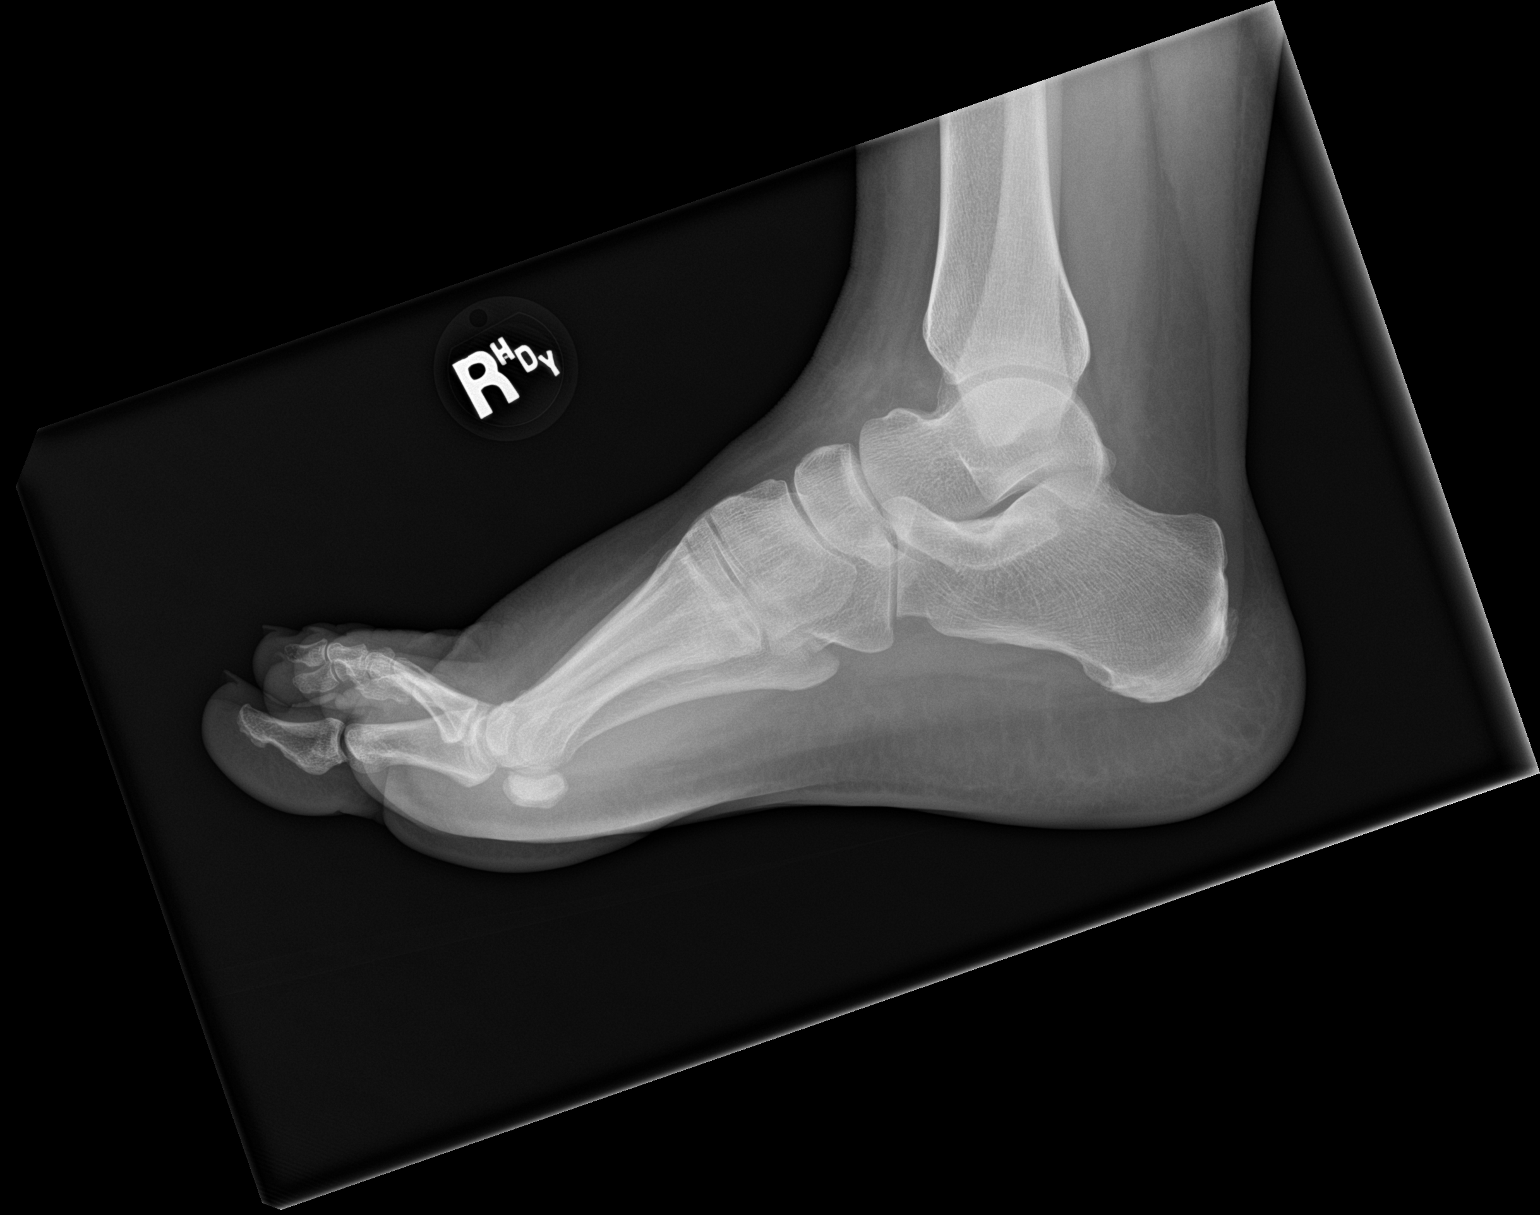

[3 of 3 positions shown; findings below may reference images not displayed]

FINDINGS: Previously described calcaneal spurs. No fracture or dislocation is
seen.
IMPRESSION: 1. No fracture or dislocation.
2. Calcaneal spurs.

## 2019-10-11 ENCOUNTER — Other Ambulatory Visit: Payer: Self-pay | Admitting: Cardiology

## 2019-10-11 ENCOUNTER — Telehealth: Payer: Self-pay | Admitting: Cardiology

## 2019-10-11 NOTE — Telephone Encounter (Signed)
New Message     *STAT* If patient is at the pharmacy, call can be transferred to refill team.   1. Which medications need to be refilled? (please list name of each medication and dose if known) verapamil (CALAN-SR) 120 MG CR tablet(Expired)  2. Which pharmacy/location (including street and city if local pharmacy) is medication to be sent to? [id number] [place], [place] - [address]  3. Do they need a 30 day or 90 day supply? Pt has expired pills and is needing to pick up a small supply today. She has an appointment tomorrow

## 2019-10-11 NOTE — Telephone Encounter (Signed)
Reill for 15 pills sent in until after appointment.

## 2019-10-12 ENCOUNTER — Ambulatory Visit: Payer: Medicaid Other | Admitting: Cardiology

## 2019-10-18 ENCOUNTER — Other Ambulatory Visit: Payer: Self-pay | Admitting: Cardiology

## 2019-10-21 ENCOUNTER — Ambulatory Visit (INDEPENDENT_AMBULATORY_CARE_PROVIDER_SITE_OTHER): Payer: Medicaid Other | Admitting: Cardiology

## 2019-10-21 ENCOUNTER — Encounter: Payer: Self-pay | Admitting: Cardiology

## 2019-10-21 ENCOUNTER — Other Ambulatory Visit: Payer: Self-pay

## 2019-10-21 VITALS — BP 128/90 | HR 89 | Ht 64.0 in | Wt 275.0 lb

## 2019-10-21 DIAGNOSIS — I10 Essential (primary) hypertension: Secondary | ICD-10-CM

## 2019-10-21 DIAGNOSIS — R002 Palpitations: Secondary | ICD-10-CM

## 2019-10-21 NOTE — Progress Notes (Signed)
Cardiology Office Note:    Date:  10/21/2019   ID:  Christine Parks, Christine Parks 1981-07-16, MRN [id number]  PCP:  Patient, No Pcp Per  Cardiologist:  Garwin Brothers, MD   Referring MD: No ref. provider found    ASSESSMENT:    1. Essential hypertension   2. Palpitations    PLAN:    In order of problems listed above:  1. Palpitations: I discussed my findings with the patient at length.  These have resolved.  Her verapamil is helping her. 2. Essential hypertension: Blood pressure stable she will continue to take current medications as it is helping her palpitations and hypertension. 3. Morbid obesity: Diet was discussed and risks of obesity explained and she vocalized understanding. 4. Patient will be seen in follow-up appointment in 6 months or earlier if the patient has any concerns    Medication Adjustments/Labs and Tests Ordered: Current medicines are reviewed at length with the patient today.  Concerns regarding medicines are outlined above.  No orders of the defined types were placed in this encounter.  No orders of the defined types were placed in this encounter.    Chief Complaint  Patient presents with  . Follow-up    3 Months     History of Present Illness:    Christine Parks is a 39 y.o. female.  Patient has past medical history of essential hypertension.  She gives history of intermittent chest pain in the past.  Subsequently she is done well.  She tells me now that she had the Covid infection and she got better.  No orthopnea or PND.  She walks on a regular basis without any symptoms and trying to lose weight.  At the time of my evaluation, the patient is alert awake oriented and in no distress.  Past Medical History:  Diagnosis Date  . Bilateral leg edema 10/31/2016   Last Assessment & Plan:  Patient does have bilateral lower extremity edema she says that is coming from the verapamil 120 3 times a day since the dose is higher than her regular dose of 240  could be contributing for it we'll try to change her to Verapamil SR 240 mg a day and see how she does if she continues to have lower extremity edema we'll check echocardiogram.  . Chest pain 06/28/2013   Last Assessment & Plan:  Patient reports intermittent chest pain accompanied by dyspnea and anxiety.  Her pain has pleuritic component to it.  However patient is unable to exercise the past several month due to complaints of exertional dyspnea.  She tells that she feels unsafe.  Therefore will investigate with exercise echocardiogram.  . Dizziness 10/31/2016   Last Assessment & Plan:  As above.  We'll try to change her to Verapamil SR and see how she does if she continues to have problems then we will consider changing it.  . Essential hypertension 10/31/2016   Last Assessment & Plan:  Patient with known hypertension did very well with the meropenem will SR 240 mg a day.  Her primary was giving her verapamil 120 mg 3 times a day and which is not helping her blood pressure on top of that she is having a lot of side effects with the edema and dyspnea on exertion and palpitations.  She wants to get back on her regular where panel to 40 mg SR so we'll go ahe  . Hypertension   . Palpitations 10/31/2016   Last Assessment & Plan:  Patient says  that palpitations are also happening since she is on short-acting meropenem L plan as about we'll change it and really reevaluate how the palpitations are.    Past Surgical History:  Procedure Laterality Date  . ABDOMINAL HYSTERECTOMY    . CESAREAN SECTION  11/26/2001  . CESAREAN SECTION  06/17/2004  . LAPAROSCOPIC HYSTERECTOMY    . TONSILLECTOMY      Current Medications: Current Meds  Medication Sig  . verapamil (CALAN-SR) 120 MG CR tablet TAKE 1 TABLET BY MOUTH EVERY DAY     Allergies:   Penicillins   Social History   Socioeconomic History  . Marital status: Married    Spouse name: Not on file  . Number of children: Not on file  . Years of  education: Not on file  . Highest education level: Not on file  Occupational History  . Not on file  Tobacco Use  . Smoking status: Former Smoker    Types: Cigarettes    Quit date: 10/25/2014    Years since quitting: 4.9  . Smokeless tobacco: Never Used  Substance and Sexual Activity  . Alcohol use: Yes    Comment: occasional   . Drug use: No  . Sexual activity: Yes    Birth control/protection: Surgical  Other Topics Concern  . Not on file  Social History Narrative  . Not on file   Social Determinants of Health   Financial Resource Strain:   . Difficulty of Paying Living Expenses:   Food Insecurity:   . Worried About Charity fundraiser in the Last Year:   . Arboriculturist in the Last Year:   Transportation Needs:   . Film/video editor (Medical):   Marland Kitchen Lack of Transportation (Non-Medical):   Physical Activity:   . Days of Exercise per Week:   . Minutes of Exercise per Session:   Stress:   . Feeling of Stress :   Social Connections:   . Frequency of Communication with Friends and Family:   . Frequency of Social Gatherings with Friends and Family:   . Attends Religious Services:   . Active Member of Clubs or Organizations:   . Attends Archivist Meetings:   Marland Kitchen Marital Status:      Family History: The patient's family history includes Diabetes in her father and mother; Hypertension in her father and mother.  ROS:   Please see the history of present illness.    All other systems reviewed and are negative.  EKGs/Labs/Other Studies Reviewed:    The following studies were reviewed today: EKG reveals sinus rhythm and nonspecific ST-T changes.   Recent Labs: No results found for requested labs within last 8760 hours.  Recent Lipid Panel No results found for: CHOL, TRIG, HDL, CHOLHDL, VLDL, LDLCALC, LDLDIRECT  Physical Exam:    VS:  BP 128/90   Pulse 89   Ht 5\' 4"  (1.626 m)   Wt 275 lb (124.7 kg)   SpO2 98%   BMI 47.20 kg/m     Wt Readings  from Last 3 Encounters:  10/21/19 275 lb (124.7 kg)  08/18/18 267 lb 3.2 oz (121.2 kg)  06/27/18 220 lb (99.8 kg)     GEN: Patient is in no acute distress HEENT: Normal NECK: No JVD; No carotid bruits LYMPHATICS: No lymphadenopathy CARDIAC: Hear sounds regular, 2/6 systolic murmur at the apex. RESPIRATORY:  Clear to auscultation without rales, wheezing or rhonchi  ABDOMEN: Soft, non-tender, non-distended MUSCULOSKELETAL:  No edema; No deformity  SKIN:  Warm and dry NEUROLOGIC:  Alert and oriented x 3 PSYCHIATRIC:  Normal affect   Signed, Garwin Brothers, MD  10/21/2019 10:57 AM    [facility]

## 2019-10-21 NOTE — Patient Instructions (Signed)

## 2020-02-10 DIAGNOSIS — N390 Urinary tract infection, site not specified: Secondary | ICD-10-CM | POA: Diagnosis not present

## 2020-02-10 DIAGNOSIS — K5730 Diverticulosis of large intestine without perforation or abscess without bleeding: Secondary | ICD-10-CM | POA: Diagnosis not present

## 2020-02-10 DIAGNOSIS — R1010 Upper abdominal pain, unspecified: Secondary | ICD-10-CM | POA: Diagnosis not present

## 2020-02-10 DIAGNOSIS — E669 Obesity, unspecified: Secondary | ICD-10-CM | POA: Diagnosis not present

## 2020-02-10 DIAGNOSIS — K5900 Constipation, unspecified: Secondary | ICD-10-CM | POA: Diagnosis not present

## 2020-02-10 DIAGNOSIS — I10 Essential (primary) hypertension: Secondary | ICD-10-CM | POA: Diagnosis not present

## 2020-04-05 ENCOUNTER — Telehealth: Payer: Self-pay | Admitting: Cardiology

## 2020-04-05 NOTE — Telephone Encounter (Signed)
Pt c/o medication issue:  1. Name of Medication: verapamil (CALAN-SR) 120 MG CR tablet  2. How are you currently taking this medication (dosage and times per day)? Pt is not taking;  took last dose on Monday   3. Are you having a reaction (difficulty breathing--STAT)?   4. What is your medication issue? Pt states she has like a heavy feeling on her chest and anxiety when she takes the medication. She feels better when she doesn't take it. She wanted to come in to see Dr. Tomie China but no appointments were available until the end of September.

## 2020-04-05 NOTE — Telephone Encounter (Signed)
Appointment made with pt on 04/07/20 at 3:00 for chest tightness with medicine.

## 2020-04-07 ENCOUNTER — Ambulatory Visit (INDEPENDENT_AMBULATORY_CARE_PROVIDER_SITE_OTHER): Payer: Medicaid Other | Admitting: Cardiology

## 2020-04-07 ENCOUNTER — Encounter: Payer: Self-pay | Admitting: Cardiology

## 2020-04-07 ENCOUNTER — Telehealth: Payer: Self-pay | Admitting: Cardiology

## 2020-04-07 DIAGNOSIS — R002 Palpitations: Secondary | ICD-10-CM | POA: Diagnosis not present

## 2020-04-07 DIAGNOSIS — I10 Essential (primary) hypertension: Secondary | ICD-10-CM | POA: Diagnosis not present

## 2020-04-07 NOTE — Telephone Encounter (Signed)
   Pt said she had her 2nd covid vaccine and not feeling well, she would like to know if she can change her appt today to virtual appt.

## 2020-04-07 NOTE — Progress Notes (Signed)
Cardiology Office Note:    Date:  04/07/2020   ID:  Christine Parks, Leach 06-18-1981, MRN [id number]  PCP:  Patient, No Pcp Per  Cardiologist:  Garwin Brothers, MD   Referring MD: No ref. provider found    ASSESSMENT:    1. Essential hypertension   2. Palpitations    PLAN:    In order of problems listed above:  1. The consultation or visit was done via televisit.  Patient wanted only audio visit and not with you and I respect her wishes.  She tells me that she will keep a track of her blood pressures.  Her palpitations have resolved for most part. 2. Essential hypertension: Blood pressure is stable and she will provide [place] a list of her blood pressures in the next few days. 3. I told her to come for a follow-up appointment in a month we will do an early morning appointment with fasting blood work.  She will call us before this if she has any significant issues.  Patient's questions were answered to her satisfaction.   Medication Adjustments/Labs and Tests Ordered: Current medicines are reviewed at length with the patient today.  Concerns regarding medicines are outlined above.  No orders of the defined types were placed in this encounter.  No orders of the defined types were placed in this encounter.    Chief Complaint  Patient presents with   Follow-up    Pt states that in the past 2 days she had chest pressure and shortness of breath. Pt states it was like anxiety. Pt states it lasted all day. Pt states that she stopped the medicince 3 weeks ago and took it again this week and had the same sx. Pt states that she had her second COVID vaccine yesterday and feels very tired.     History of Present Illness:    Christine Parks is a 39 y.o. female.  Patient has past medical history of palpitations.  She was given verapamil.  She tells me that she felt better taking the verapamil.  She did not want to come in today as she took the Covid short and was feeling fine.  So the  evaluation was done by the phone.  She did not wish to do it by video.  At the time of my evaluation, the patient is alert awake oriented and in no distress.  No orthopnea or PND.  Her palpitations have almost resolved.  Past Medical History:  Diagnosis Date   Bilateral leg edema 10/31/2016   Last Assessment & Plan:  Patient does have bilateral lower extremity edema she says that is coming from the verapamil 120 3 times a day since the dose is higher than her regular dose of 240 could be contributing for it we'll try to change her to Verapamil SR 240 mg a day and see how she does if she continues to have lower extremity edema we'll check echocardiogram.   Chest pain 06/28/2013   Last Assessment & Plan:  Patient reports intermittent chest pain accompanied by dyspnea and anxiety.  Her pain has pleuritic component to it.  However patient is unable to exercise the past several month due to complaints of exertional dyspnea.  She tells that she feels unsafe.  Therefore will investigate with exercise echocardiogram.   Dizziness 10/31/2016   Last Assessment & Plan:  As above.  We'll try to change her to Verapamil SR and see how she does if she continues to have problems then we  will consider changing it.   Essential hypertension 10/31/2016   Last Assessment & Plan:  Patient with known hypertension did very well with the meropenem will SR 240 mg a day.  Her primary was giving her verapamil 120 mg 3 times a day and which is not helping her blood pressure on top of that she is having a lot of side effects with the edema and dyspnea on exertion and palpitations.  She wants to get back on her regular where panel to 40 mg SR so we'll go ahe   Hypertension    Palpitations 10/31/2016   Last Assessment & Plan:  Patient says that palpitations are also happening since she is on short-acting meropenem L plan as about we'll change it and really reevaluate how the palpitations are.    Past Surgical History:  Procedure  Laterality Date   ABDOMINAL HYSTERECTOMY     CESAREAN SECTION  11/26/2001   CESAREAN SECTION  06/17/2004   LAPAROSCOPIC HYSTERECTOMY     TONSILLECTOMY      Current Medications: No outpatient medications have been marked as taking for the 04/07/20 encounter (Office Visit) with Jamilex Bohnsack, Aundra Dubin, MD.     Allergies:   Penicillins   Social History   Socioeconomic History   Marital status: Married    Spouse name: Not on file   Number of children: Not on file   Years of education: Not on file   Highest education level: Not on file  Occupational History   Not on file  Tobacco Use   Smoking status: Former Smoker    Types: Cigarettes    Quit date: 10/25/2014    Years since quitting: 5.4   Smokeless tobacco: Never Used  Vaping Use   Vaping Use: Never used  Substance and Sexual Activity   Alcohol use: Yes    Comment: occasional glass of wine   Drug use: No   Sexual activity: Yes    Birth control/protection: Surgical  Other Topics Concern   Not on file  Social History Narrative   Not on file   Social Determinants of Health   Financial Resource Strain:    Difficulty of Paying Living Expenses: Not on file  Food Insecurity:    Worried About Running Out of Food in the Last Year: Not on file   [employer] of Food in the Last Year: Not on file  Transportation Needs:    Lack of Transportation (Medical): Not on file   Lack of Transportation (Non-Medical): Not on file  Physical Activity:    Days of Exercise per Week: Not on file   Minutes of Exercise per Session: Not on file  Stress:    Feeling of Stress : Not on file  Social Connections:    Frequency of Communication with Friends and Family: Not on file   Frequency of Social Gatherings with Friends and Family: Not on file   Attends Religious Services: Not on file   Active Member of Clubs or Organizations: Not on file   Attends Banker Meetings: Not on file   Marital Status: Not on  file     Family History: The patient's family history includes Diabetes in her father and mother; Hypertension in her father and mother.  ROS:   Please see the history of present illness.    All other systems reviewed and are negative.  EKGs/Labs/Other Studies Reviewed:    The following studies were reviewed today: I discussed my findings with the patient at length.  Vital  signs were not available and not provided by the patient.   Recent Labs: No results found for requested labs within last 8760 hours.  Recent Lipid Panel No results found for: CHOL, TRIG, HDL, CHOLHDL, VLDL, LDLCALC, LDLDIRECT  Physical Exam:    VS:  There were no vitals taken for this visit.    Wt Readings from Last 3 Encounters:  10/21/19 275 lb (124.7 kg)  08/18/18 267 lb 3.2 oz (121.2 kg)  06/27/18 220 lb (99.8 kg)     GEN: Patient is in no acute distress nd oriented x 3 PSYCHIATRIC:  Normal affect   Signed, Garwin Brothers, MD  04/07/2020 3:41 PM    [facility]

## 2020-05-12 ENCOUNTER — Ambulatory Visit: Payer: Medicaid Other | Admitting: Cardiology

## 2020-11-02 DIAGNOSIS — Z6841 Body Mass Index (BMI) 40.0 and over, adult: Secondary | ICD-10-CM | POA: Diagnosis not present

## 2020-11-02 DIAGNOSIS — E559 Vitamin D deficiency, unspecified: Secondary | ICD-10-CM | POA: Diagnosis not present

## 2020-11-02 DIAGNOSIS — N951 Menopausal and female climacteric states: Secondary | ICD-10-CM | POA: Diagnosis not present

## 2020-11-02 DIAGNOSIS — E8881 Metabolic syndrome: Secondary | ICD-10-CM | POA: Diagnosis not present

## 2020-11-02 DIAGNOSIS — R635 Abnormal weight gain: Secondary | ICD-10-CM | POA: Diagnosis not present

## 2020-11-02 DIAGNOSIS — I10 Essential (primary) hypertension: Secondary | ICD-10-CM | POA: Diagnosis not present

## 2020-11-06 DIAGNOSIS — Z1339 Encounter for screening examination for other mental health and behavioral disorders: Secondary | ICD-10-CM | POA: Diagnosis not present

## 2020-11-06 DIAGNOSIS — Z1331 Encounter for screening for depression: Secondary | ICD-10-CM | POA: Diagnosis not present

## 2020-11-06 DIAGNOSIS — E559 Vitamin D deficiency, unspecified: Secondary | ICD-10-CM | POA: Diagnosis not present

## 2020-11-06 DIAGNOSIS — E786 Lipoprotein deficiency: Secondary | ICD-10-CM | POA: Diagnosis not present

## 2020-11-06 DIAGNOSIS — Z789 Other specified health status: Secondary | ICD-10-CM | POA: Diagnosis not present

## 2020-11-06 DIAGNOSIS — E8881 Metabolic syndrome: Secondary | ICD-10-CM | POA: Diagnosis not present

## 2020-11-06 DIAGNOSIS — I10 Essential (primary) hypertension: Secondary | ICD-10-CM | POA: Diagnosis not present

## 2020-11-06 DIAGNOSIS — N951 Menopausal and female climacteric states: Secondary | ICD-10-CM | POA: Diagnosis not present

## 2020-11-07 DIAGNOSIS — I10 Essential (primary) hypertension: Secondary | ICD-10-CM | POA: Insufficient documentation

## 2020-11-08 ENCOUNTER — Telehealth: Payer: Self-pay | Admitting: Cardiology

## 2020-11-08 ENCOUNTER — Ambulatory Visit: Payer: Medicaid Other | Admitting: Cardiology

## 2020-11-08 NOTE — Telephone Encounter (Signed)
Patient called an cancelled her appointment today, 11/08/20 at 8:00 AM with Dr. Tomie China. She states she has had a lot on her mind lately.  Her daughter also asked her to drive her to work because she had to work at 8:00 AM.  She states she agreed to do so, assuming her appointment was close by, as she is normally seen at the office in [facility]. Remembering her appointment was for the office in [place], she requested a virtual appointment with Dr. Tomie China. I informed her, per the appointment notes, she needs an EKG and she will have to come into the office. She states she recently had an EKG with [facility] MD and she would like to know if she can have the EKG report sent to [place]. She states she is still hoping to speak with Dr. Tomie China to discuss her weight loss program with Delrae Rend MD. I contacted Dr. Kem Parkinson nurse to see if he will see the patient virtually, but I was unable to reach her in time. Please contact the patient when able to let her know if Dr. Tomie China will see her virtually and accept an EKG report from [facility] MD.

## 2020-11-08 NOTE — Telephone Encounter (Signed)
I can see her virtually in the next few days.

## 2020-11-10 DIAGNOSIS — E559 Vitamin D deficiency, unspecified: Secondary | ICD-10-CM | POA: Diagnosis not present

## 2020-11-10 DIAGNOSIS — Z6841 Body Mass Index (BMI) 40.0 and over, adult: Secondary | ICD-10-CM | POA: Diagnosis not present

## 2020-11-10 DIAGNOSIS — I10 Essential (primary) hypertension: Secondary | ICD-10-CM | POA: Diagnosis not present

## 2020-11-17 ENCOUNTER — Encounter: Payer: Self-pay | Admitting: Cardiology

## 2020-11-17 ENCOUNTER — Telehealth (INDEPENDENT_AMBULATORY_CARE_PROVIDER_SITE_OTHER): Payer: Medicaid Other | Admitting: Cardiology

## 2020-11-17 ENCOUNTER — Telehealth: Payer: Self-pay

## 2020-11-17 VITALS — Ht 64.0 in | Wt 276.0 lb

## 2020-11-17 DIAGNOSIS — I10 Essential (primary) hypertension: Secondary | ICD-10-CM

## 2020-11-17 NOTE — Progress Notes (Signed)
Virtual Visit via Video Note   This visit type was conducted due to national recommendations for restrictions regarding the COVID-19 Pandemic (e.g. social distancing) in an effort to limit this patient's exposure and mitigate transmission in our community.  Due to her co-morbid illnesses, this patient is at least at moderate risk for complications without adequate follow up.  This format is felt to be most appropriate for this patient at this time.  All issues noted in this document were discussed and addressed.  A limited physical exam was performed with this format.  Please refer to the patient's chart for her consent to telehealth for [facility].       Date:  11/17/2020   ID:  Christine Parks, Christine Parks 1981/03/03, MRN [id number] The patient was identified using 2 identifiers.  Patient Location: Home Provider Location: Office/Clinic   PCP:  Patient, No Pcp Per (Inactive)   [facility]  Cardiologist:  No primary care provider on file.  Advanced Practice Provider:  No care team member to display Electrophysiologist:  None  {  Evaluation Performed:  Follow-Up Visit  Chief Complaint: Essential hypertension  History of Present Illness:    Christine Parks is a 40 y.o. female with past medical history of essential hypertension and morbid obesity.  She mentions to me that overall she is doing fine.  She had blood work recently at her primary care doctor's office.  She tells me that she does not like the way she presents with verapamil and wants to change the medication.  At the time of my evaluation, the patient is alert awake oriented and in no distress.  The patient does not have symptoms concerning for COVID-19 infection (fever, chills, cough, or new shortness of breath).    Past Medical History:  Diagnosis Date  . Abnormal EKG 01/15/2018  . Bilateral leg edema 10/31/2016   Last Assessment & Plan:  Patient does have bilateral lower extremity edema she  says that is coming from the verapamil 120 3 times a day since the dose is higher than her regular dose of 240 could be contributing for it we'll try to change her to Verapamil SR 240 mg a day and see how she does if she continues to have lower extremity edema we'll check echocardiogram.  . Chest pain 06/28/2013   Last Assessment & Plan:  Patient reports intermittent chest pain accompanied by dyspnea and anxiety.  Her pain has pleuritic component to it.  However patient is unable to exercise the past several month due to complaints of exertional dyspnea.  She tells that she feels unsafe.  Therefore will investigate with exercise echocardiogram.  . Dizziness 10/31/2016   Last Assessment & Plan:  As above.  We'll try to change her to Verapamil SR and see how she does if she continues to have problems then we will consider changing it.  . Essential hypertension 10/31/2016   Last Assessment & Plan:  Patient with known hypertension did very well with the meropenem will SR 240 mg a day.  Her primary was giving her verapamil 120 mg 3 times a day and which is not helping her blood pressure on top of that she is having a lot of side effects with the edema and dyspnea on exertion and palpitations.  She wants to get back on her regular where panel to 40 mg SR so we'll go ahe  . Exposure to STD 08/18/2018  . Hypertension   . Palpitations 10/31/2016  Last Assessment & Plan:  Patient says that palpitations are also happening since she is on short-acting meropenem L plan as about we'll change it and really reevaluate how the palpitations are.  . Vaginal dryness 08/18/2018  . Visit for routine gyn exam 08/18/2018   Past Surgical History:  Procedure Laterality Date  . ABDOMINAL HYSTERECTOMY    . CESAREAN SECTION  11/26/2001  . CESAREAN SECTION  06/17/2004  . LAPAROSCOPIC HYSTERECTOMY    . TONSILLECTOMY       No outpatient medications have been marked as taking for the 11/17/20 encounter (Video Visit) with Sherrell Weir,  Aundra Dubin, MD.     Allergies:   Penicillins   Social History   Tobacco Use  . Smoking status: Former Smoker    Types: Cigarettes    Quit date: 10/25/2014    Years since quitting: 6.0  . Smokeless tobacco: Never Used  Vaping Use  . Vaping Use: Never used  Substance Use Topics  . Alcohol use: Yes    Comment: occasional glass of wine  . Drug use: No     Family Hx: The patient's family history includes Diabetes in her father and mother; Hypertension in her father and mother.  ROS:   Please see the history of present illness.    I discussed my findings with the patient at length. All other systems reviewed and are negative.   Prior CV studies:   The following studies were reviewed today:  EKG from past was reviewed  Labs/Other Tests and Data Reviewed:    EKG:  EKG from the past was reviewed.  Recent Labs: No results found for requested labs within last 8760 hours.   Recent Lipid Panel No results found for: CHOL, TRIG, HDL, CHOLHDL, LDLCALC, LDLDIRECT  Wt Readings from Last 3 Encounters:  11/17/20 276 lb (125.2 kg)  10/21/19 275 lb (124.7 kg)  08/18/18 267 lb 3.2 oz (121.2 kg)     Risk Assessment/Calculations:      Objective:    Vital Signs:  Ht 5\' 4"  (1.626 m)   Wt 276 lb (125.2 kg)   BMI 47.38 kg/m    VITAL SIGNS:  reviewed Patient did not provide me with vital signs.  ASSESSMENT & PLAN:    1. Essential hypertension: Patient mentions to me that she had blood pressure readings that she will get for on Monday.  I will review them and change her verapamil to another medication.  She will get me her pulse blood pressure.  She also mentions to me that she will bring your lab work from her primary care provider's office also on Monday.  I will review this also.  This will help me decide her blood pressure medication. 2. Morbid obesity: Diet was emphasized.  Weight reduction stressed.  She promises to do better.  Importance of exercise stressed.  Lipids  followed by primary care physician.      COVID-19 Education: The signs and symptoms of COVID-19 were discussed with the patient and how to seek care for testing (follow up with PCP or arrange E-visit).  The importance of social distancing was discussed today.  Time:   Today, I have spent 15 minutes with the patient with telehealth technology discussing the above problems.     Medication Adjustments/Labs and Tests Ordered: Current medicines are reviewed at length with the patient today.  Concerns regarding medicines are outlined above.   Tests Ordered: No orders of the defined types were placed in this encounter.   Medication Changes:  No orders of the defined types were placed in this encounter.   Follow Up:  In Person in 3 month(s)  Signed, Garwin Brothers, MD  11/17/2020 11:06 AM    [facility]

## 2020-11-17 NOTE — Telephone Encounter (Signed)
  Patient Consent for Virtual Visit         Christine Parks has provided verbal consent on 11/17/2020 for a virtual visit (video or telephone).   CONSENT FOR VIRTUAL VISIT FOR:  Christine Parks  By participating in this virtual visit I agree to the following:  I hereby voluntarily request, consent and authorize [facility] and its employed or contracted physicians, physician assistants, nurse practitioners or other licensed health care professionals (the Practitioner), to provide me with telemedicine health care services (the "Services") as deemed necessary by the treating Practitioner. I acknowledge and consent to receive the Services by the Practitioner via telemedicine. I understand that the telemedicine visit will involve communicating with the Practitioner through live audiovisual communication technology and the disclosure of certain medical information by electronic transmission. I acknowledge that I have been given the opportunity to request an in-person assessment or other available alternative prior to the telemedicine visit and am voluntarily participating in the telemedicine visit.  I understand that I have the right to withhold or withdraw my consent to the use of telemedicine in the course of my care at any time, without affecting my right to future care or treatment, and that the Practitioner or I may terminate the telemedicine visit at any time. I understand that I have the right to inspect all information obtained and/or recorded in the course of the telemedicine visit and may receive copies of available information for a reasonable fee.  I understand that some of the potential risks of receiving the Services via telemedicine include:  Marland Kitchen Delay or interruption in medical evaluation due to technological equipment failure or disruption; . Information transmitted may not be sufficient (e.g. poor resolution of images) to allow for appropriate medical decision making by the  Practitioner; and/or  . In rare instances, security protocols could fail, causing a breach of personal health information.  Furthermore, I acknowledge that it is my responsibility to provide information about my medical history, conditions and care that is complete and accurate to the best of my ability. I acknowledge that Practitioner's advice, recommendations, and/or decision may be based on factors not within their control, such as incomplete or inaccurate data provided by me or distortions of diagnostic images or specimens that may result from electronic transmissions. I understand that the practice of medicine is not an exact science and that Practitioner makes no warranties or guarantees regarding treatment outcomes. I acknowledge that a copy of this consent can be made available to me via my patient portal [facility] MyChart), or I can request a printed copy by calling the office of [facility].    I understand that my insurance will be billed for this visit.   I have read or had this consent read to me. . I understand the contents of this consent, which adequately explains the benefits and risks of the Services being provided via telemedicine.  . I have been provided ample opportunity to ask questions regarding this consent and the Services and have had my questions answered to my satisfaction. . I give my informed consent for the services to be provided through the use of telemedicine in my medical care

## 2020-11-22 DIAGNOSIS — Z789 Other specified health status: Secondary | ICD-10-CM | POA: Diagnosis not present

## 2020-11-22 DIAGNOSIS — Z6841 Body Mass Index (BMI) 40.0 and over, adult: Secondary | ICD-10-CM | POA: Diagnosis not present

## 2020-11-28 DIAGNOSIS — J069 Acute upper respiratory infection, unspecified: Secondary | ICD-10-CM | POA: Diagnosis not present

## 2020-11-30 DIAGNOSIS — Z789 Other specified health status: Secondary | ICD-10-CM | POA: Diagnosis not present

## 2020-11-30 DIAGNOSIS — I10 Essential (primary) hypertension: Secondary | ICD-10-CM | POA: Diagnosis not present

## 2020-11-30 DIAGNOSIS — Z6841 Body Mass Index (BMI) 40.0 and over, adult: Secondary | ICD-10-CM | POA: Diagnosis not present

## 2020-12-07 DIAGNOSIS — E8881 Metabolic syndrome: Secondary | ICD-10-CM | POA: Diagnosis not present

## 2020-12-07 DIAGNOSIS — Z6841 Body Mass Index (BMI) 40.0 and over, adult: Secondary | ICD-10-CM | POA: Diagnosis not present

## 2021-04-16 ENCOUNTER — Emergency Department: Payer: Medicaid Other

## 2021-04-16 ENCOUNTER — Encounter: Payer: Self-pay | Admitting: Emergency Medicine

## 2021-04-16 ENCOUNTER — Other Ambulatory Visit: Payer: Self-pay

## 2021-04-16 ENCOUNTER — Emergency Department
Admission: EM | Admit: 2021-04-16 | Discharge: 2021-04-16 | Disposition: A | Discharge status: Home / Self Care | Payer: Medicaid Other | Attending: Emergency Medicine | Admitting: Emergency Medicine

## 2021-04-16 DIAGNOSIS — Z87891 Personal history of nicotine dependence: Secondary | ICD-10-CM | POA: Insufficient documentation

## 2021-04-16 DIAGNOSIS — W19XXXA Unspecified fall, initial encounter: Secondary | ICD-10-CM | POA: Insufficient documentation

## 2021-04-16 DIAGNOSIS — I10 Essential (primary) hypertension: Secondary | ICD-10-CM | POA: Insufficient documentation

## 2021-04-16 DIAGNOSIS — M79661 Pain in right lower leg: Secondary | ICD-10-CM | POA: Insufficient documentation

## 2021-04-16 DIAGNOSIS — M79604 Pain in right leg: Secondary | ICD-10-CM

## 2021-04-16 MED ORDER — MELOXICAM 15 MG PO TABS: 15.0000 mg | ORAL_TABLET | Freq: Every day | ORAL | 2 refills | Status: DC

## 2021-04-16 MED ORDER — KETOROLAC TROMETHAMINE 30 MG/ML IJ SOLN
30.0000 mg | Freq: Once | INTRAMUSCULAR | Status: AC
  Administered 2021-04-16: 30 mg via INTRAMUSCULAR
  Filled 2021-04-16: qty 1

## 2021-04-16 NOTE — ED Triage Notes (Signed)
Pt presents via POV with c/o right lower leg pain. Pt states she had mechanical fall prior to arrival. Pt denies syncope or LOC. Pt able to walk on affected extremity.

## 2021-04-16 NOTE — Discharge Instructions (Addendum)
You can take Meloxicam once daily for pain and inflammation.  ?

## 2021-04-17 ENCOUNTER — Telehealth: Payer: Self-pay

## 2021-04-17 NOTE — Telephone Encounter (Signed)
Transition Care Management Follow-up Telephone Call Date of discharge and from where: 04/16/2021-[facility] How have you been since you were released from the hospital? Patient stated she is doing fine.  Any questions or concerns? No  Items Reviewed: Did the pt receive and understand the discharge instructions provided? Yes  Medications obtained and verified? Yes  Other? No  Any new allergies since your discharge? No  Dietary orders reviewed? N/A Do you have support at home? Yes   Home Care and Equipment/Supplies: Were home health services ordered? not applicable If so, what is the name of the agency? N/A  Has the agency set up a time to come to the patient's home? not applicable Were any new equipment or medical supplies ordered?  No What is the name of the medical supply agency? N/A Were you able to get the supplies/equipment? not applicable Do you have any questions related to the use of the equipment or supplies? No  Functional Questionnaire: (I = Independent and D = Dependent) ADLs: I  Bathing/Dressing- I  Meal Prep- I  Eating- I  Maintaining continence- I  Transferring/Ambulation- I  Managing Meds- I  Follow up appointments reviewed:  PCP Hospital f/u appt confirmed? No   Specialist Hospital f/u appt confirmed? No   Are transportation arrangements needed? No  If their condition worsens, is the pt aware to call PCP or go to the Emergency Dept.? Yes Was the patient provided with contact information for the PCP's office or ED? Yes Was to pt encouraged to call back with questions or concerns? Yes

## 2021-04-17 NOTE — ED Provider Notes (Signed)
[facility]-EMERGENCY DEPARTMENT  ____________________________________________  Time seen: Approximately 12:11 AM  I have reviewed the triage vital signs and the nursing notes.   HISTORY  Chief Complaint Leg Pain   Historian Patient   HPI Christine Parks is a 40 y.o. female presents to the emergency department with right lower leg pain after mechanical fall tonight.  Patient localizes pain to anterior shin.  No numbness or tingling.  No similar injuries in the past.   Past Medical History:  Diagnosis Date   Abnormal EKG 01/15/2018   Bilateral leg edema 10/31/2016   Last Assessment & Plan:  Patient does have bilateral lower extremity edema she says that is coming from the verapamil 120 3 times a day since the dose is higher than her regular dose of 240 could be contributing for it we'll try to change her to Verapamil SR 240 mg a day and see how she does if she continues to have lower extremity edema we'll check echocardiogram.   Chest pain 06/28/2013   Last Assessment & Plan:  Patient reports intermittent chest pain accompanied by dyspnea and anxiety.  Her pain has pleuritic component to it.  However patient is unable to exercise the past several month due to complaints of exertional dyspnea.  She tells that she feels unsafe.  Therefore will investigate with exercise echocardiogram.   Dizziness 10/31/2016   Last Assessment & Plan:  As above.  We'll try to change her to Verapamil SR and see how she does if she continues to have problems then we will consider changing it.   Essential hypertension 10/31/2016   Last Assessment & Plan:  Patient with known hypertension did very well with the meropenem will SR 240 mg a day.  Her primary was giving her verapamil 120 mg 3 times a day and which is not helping her blood pressure on top of that she is having a lot of side effects with the edema and dyspnea on exertion and palpitations.  She wants to get back on her regular where panel to 40 mg SR so  we'll go ahe   Exposure to STD 08/18/2018   Hypertension    Palpitations 10/31/2016   Last Assessment & Plan:  Patient says that palpitations are also happening since she is on short-acting meropenem L plan as about we'll change it and really reevaluate how the palpitations are.   Vaginal dryness 08/18/2018   Visit for routine gyn exam 08/18/2018     Immunizations up to date:  Yes.     Past Medical History:  Diagnosis Date   Abnormal EKG 01/15/2018   Bilateral leg edema 10/31/2016   Last Assessment & Plan:  Patient does have bilateral lower extremity edema she says that is coming from the verapamil 120 3 times a day since the dose is higher than her regular dose of 240 could be contributing for it we'll try to change her to Verapamil SR 240 mg a day and see how she does if she continues to have lower extremity edema we'll check echocardiogram.   Chest pain 06/28/2013   Last Assessment & Plan:  Patient reports intermittent chest pain accompanied by dyspnea and anxiety.  Her pain has pleuritic component to it.  However patient is unable to exercise the past several month due to complaints of exertional dyspnea.  She tells that she feels unsafe.  Therefore will investigate with exercise echocardiogram.   Dizziness 10/31/2016   Last Assessment & Plan:  As above.  We'll try to change  her to Verapamil SR and see how she does if she continues to have problems then we will consider changing it.   Essential hypertension 10/31/2016   Last Assessment & Plan:  Patient with known hypertension did very well with the meropenem will SR 240 mg a day.  Her primary was giving her verapamil 120 mg 3 times a day and which is not helping her blood pressure on top of that she is having a lot of side effects with the edema and dyspnea on exertion and palpitations.  She wants to get back on her regular where panel to 40 mg SR so we'll go ahe   Exposure to STD 08/18/2018   Hypertension    Palpitations 10/31/2016   Last Assessment  & Plan:  Patient says that palpitations are also happening since she is on short-acting meropenem L plan as about we'll change it and really reevaluate how the palpitations are.   Vaginal dryness 08/18/2018   Visit for routine gyn exam 08/18/2018    Patient Active Problem List   Diagnosis Date Noted   Morbid obesity (HCC) 11/17/2020   Hypertension    Visit for routine gyn exam 08/18/2018   Exposure to STD 08/18/2018   Vaginal dryness 08/18/2018   Abnormal EKG 01/15/2018   Bilateral leg edema 10/31/2016   Dizziness 10/31/2016   Essential hypertension 10/31/2016   Palpitations 10/31/2016   Chest pain 06/28/2013    Past Surgical History:  Procedure Laterality Date   ABDOMINAL HYSTERECTOMY     CESAREAN SECTION  11/26/2001   CESAREAN SECTION  06/17/2004   LAPAROSCOPIC HYSTERECTOMY     TONSILLECTOMY      Prior to Admission medications   Medication Sig Start Date End Date Taking? Authorizing Provider  meloxicam (MOBIC) 15 MG tablet Take 1 tablet (15 mg total) by mouth daily. 04/16/21 04/16/22 Yes Pia MauWoods, Isabellarose Kope M, PA-C    Allergies Penicillins  Family History  Problem Relation Age of Onset   Diabetes Mother    Hypertension Mother    Diabetes Father    Hypertension Father     Social History Social History   Tobacco Use   Smoking status: Former    Types: Cigarettes    Quit date: 10/25/2014    Years since quitting: 6.4   Smokeless tobacco: Never  Vaping Use   Vaping Use: Never used  Substance Use Topics   Alcohol use: Yes    Comment: occasional glass of wine   Drug use: No     Review of Systems  Constitutional: No fever/chills Eyes:  No discharge ENT: No upper respiratory complaints. Respiratory: no cough. No SOB/ use of accessory muscles to breath Gastrointestinal:   No nausea, no vomiting.  No diarrhea.  No constipation. Musculoskeletal: patient has right lower leg pain.  Skin: Negative for rash, abrasions, lacerations,  ecchymosis.    ____________________________________________   PHYSICAL EXAM:  VITAL SIGNS: ED Triage Vitals  Enc Vitals Group     BP 04/16/21 2053 135/74     Pulse Rate 04/16/21 2053 (!) 102     Resp 04/16/21 2053 18     Temp 04/16/21 2053 98 F (36.7 C)     Temp Source 04/16/21 2053 Oral     SpO2 04/16/21 2053 100 %     Weight --      Height --      Head Circumference --      Peak Flow --      Pain Score 04/16/21 2054 0  Pain Loc --      Pain Edu? --      Excl. in GC? --      Constitutional: Alert and oriented. Well appearing and in no acute distress. Eyes: Conjunctivae are normal. PERRL. EOMI. Head: Atraumatic. ENT:  Cardiovascular: Normal rate, regular rhythm. Normal S1 and S2.  Good peripheral circulation. Respiratory: Normal respiratory effort without tachypnea or retractions. Lungs CTAB. Good air entry to the bases with no decreased or absent breath sounds Gastrointestinal: Bowel sounds x 4 quadrants. Soft and nontender to palpation. No guarding or rigidity. No distention. Musculoskeletal: Full range of motion to all extremities. No obvious deformities noted.  Palpable dorsalis pedis pulse bilaterally and symmetrically. Neurologic:  Normal for age. No gross focal neurologic deficits are appreciated.  Skin:  Skin is warm, dry and intact. No rash noted. Psychiatric: Mood and affect are normal for age. Speech and behavior are normal.   ____________________________________________   LABS (all labs ordered are listed, but only abnormal results are displayed)  Labs Reviewed - No data to display ____________________________________________  EKG   ____________________________________________  RADIOLOGY Geraldo Pitter, personally viewed and evaluated these images (plain radiographs) as part of my medical decision making, as well as reviewing the written report by the radiologist.  DG Tibia/Fibula Right  Result Date: 04/16/2021 CLINICAL DATA:  Fall with  lower leg pain EXAM: RIGHT TIBIA AND FIBULA - 2 VIEW COMPARISON:  None. FINDINGS: There is no evidence of fracture or other focal bone lesions. Soft tissues are unremarkable. IMPRESSION: Negative. Electronically Signed   By: Jasmine Pang M.D.   On: 04/16/2021 21:46   DG Knee Complete 4 Views Right  Result Date: 04/16/2021 CLINICAL DATA:  Fall with leg pain EXAM: RIGHT KNEE - COMPLETE 4+ VIEW COMPARISON:  None. FINDINGS: No evidence of fracture, dislocation, or joint effusion. No evidence of arthropathy or other focal bone abnormality. Soft tissues are unremarkable. IMPRESSION: Negative. Electronically Signed   By: Jasmine Pang M.D.   On: 04/16/2021 21:47    ____________________________________________    PROCEDURES  Procedure(s) performed:     Procedures     Medications  ketorolac (TORADOL) 30 MG/ML injection 30 mg (30 mg Intramuscular Given 04/16/21 2340)     ____________________________________________   INITIAL IMPRESSION / ASSESSMENT AND PLAN / ED COURSE  Pertinent labs & imaging results that were available during my care of the patient were reviewed by me and considered in my medical decision making (see chart for details).      Assessment and plan Right lower leg pain 40 year old female presents to the emergency department with right lower leg pain after a mechanical fall.  No bony abnormality was visualized on x-rays.  Patient was given an injection of Toradol prior to discharge.  She was discharged with meloxicam.     ____________________________________________  FINAL CLINICAL IMPRESSION(S) / ED DIAGNOSES  Final diagnoses:  Pain of right lower extremity      NEW MEDICATIONS STARTED DURING THIS VISIT:  ED Discharge Orders          Ordered    meloxicam (MOBIC) 15 MG tablet  Daily        04/16/21 2321                This chart was dictated using voice recognition software/Dragon. Despite best efforts to proofread, errors can occur which can  change the meaning. Any change was purely unintentional.     Orvil Feil, PA-C 04/17/21 0013    Katrinka Blazing,  Domingo Dimes, MD 04/17/21 2633

## 2021-05-03 NOTE — Telephone Encounter (Signed)
error 

## 2021-06-11 ENCOUNTER — Other Ambulatory Visit: Payer: Self-pay

## 2021-06-11 ENCOUNTER — Ambulatory Visit (HOSPITAL_COMMUNITY)
Admission: EM | Admit: 2021-06-11 | Discharge: 2021-06-11 | Disposition: A | Discharge status: Home / Self Care | Payer: Medicaid Other | Attending: Emergency Medicine | Admitting: Emergency Medicine

## 2021-06-11 ENCOUNTER — Encounter (HOSPITAL_COMMUNITY): Payer: Self-pay | Admitting: *Deleted

## 2021-06-11 DIAGNOSIS — J302 Other seasonal allergic rhinitis: Secondary | ICD-10-CM | POA: Diagnosis not present

## 2021-06-11 MED ORDER — LIDOCAINE VISCOUS HCL 2 % MT SOLN: 15.0000 mL | OROMUCOSAL | 0 refills | Status: DC | PRN

## 2021-06-11 MED ORDER — CETIRIZINE HCL 10 MG PO TABS: 10.0000 mg | ORAL_TABLET | Freq: Every day | ORAL | 0 refills | Status: DC

## 2021-06-11 MED ORDER — FLUTICASONE PROPIONATE 50 MCG/ACT NA SUSP: 1.0000 | Freq: Every day | NASAL | 0 refills | Status: DC

## 2021-06-11 NOTE — ED Provider Notes (Signed)
[facility]    CSN: [id number] Arrival date & time: 06/11/21  2703      History   Chief Complaint Chief Complaint  Patient presents with   Cough   Otalgia   Sore Throat    HPI Christine Parks is a 40 y.o. female.   Patient presents with sore throat , post nasal drip, congestion primarily upon awakening and mild non productive cough and bilateral ear pain for 3 days.  No known sick contacts.  No attempted treatment.  Tolerating food and liquids.  denies fever, chills, body aches, shortness of breath, wheezing, abdominal pain, nausea, vomiting, diarrhea.  History of hypertension, palpitations, allergies.   Past Medical History:  Diagnosis Date   Abnormal EKG 01/15/2018   Bilateral leg edema 10/31/2016   Last Assessment & Plan:  Patient does have bilateral lower extremity edema she says that is coming from the verapamil 120 3 times a day since the dose is higher than her regular dose of 240 could be contributing for it we'll try to change her to Verapamil SR 240 mg a day and see how she does if she continues to have lower extremity edema we'll check echocardiogram.   Chest pain 06/28/2013   Last Assessment & Plan:  Patient reports intermittent chest pain accompanied by dyspnea and anxiety.  Her pain has pleuritic component to it.  However patient is unable to exercise the past several month due to complaints of exertional dyspnea.  She tells that she feels unsafe.  Therefore will investigate with exercise echocardiogram.   Dizziness 10/31/2016   Last Assessment & Plan:  As above.  We'll try to change her to Verapamil SR and see how she does if she continues to have problems then we will consider changing it.   Essential hypertension 10/31/2016   Last Assessment & Plan:  Patient with known hypertension did very well with the meropenem will SR 240 mg a day.  Her primary was giving her verapamil 120 mg 3 times a day and which is not helping her blood pressure on top of that she  is having a lot of side effects with the edema and dyspnea on exertion and palpitations.  She wants to get back on her regular where panel to 40 mg SR so we'll go ahe   Exposure to STD 08/18/2018   Hypertension    Palpitations 10/31/2016   Last Assessment & Plan:  Patient says that palpitations are also happening since she is on short-acting meropenem L plan as about we'll change it and really reevaluate how the palpitations are.   Vaginal dryness 08/18/2018   Visit for routine gyn exam 08/18/2018    Patient Active Problem List   Diagnosis Date Noted   Morbid obesity (HCC) 11/17/2020   Hypertension    Visit for routine gyn exam 08/18/2018   Exposure to STD 08/18/2018   Vaginal dryness 08/18/2018   Abnormal EKG 01/15/2018   Bilateral leg edema 10/31/2016   Dizziness 10/31/2016   Essential hypertension 10/31/2016   Palpitations 10/31/2016   Chest pain 06/28/2013    Past Surgical History:  Procedure Laterality Date   ABDOMINAL HYSTERECTOMY     CESAREAN SECTION  11/26/2001   CESAREAN SECTION  06/17/2004   LAPAROSCOPIC HYSTERECTOMY     TONSILLECTOMY      OB History     Gravida  4   Para      Term      Preterm      AB  Living  4      SAB      IAB      Ectopic      Multiple      Live Births  4            Home Medications    Prior to Admission medications   Medication Sig Start Date End Date Taking? Authorizing Provider  cetirizine (ZYRTEC ALLERGY) 10 MG tablet Take 1 tablet (10 mg total) by mouth daily. 06/11/21  Yes Chico Cawood R, NP  fluticasone (FLONASE) 50 MCG/ACT nasal spray Place 1 spray into both nostrils daily. 06/11/21  Yes Victora Irby R, NP  lidocaine (XYLOCAINE) 2 % solution Use as directed 15 mLs in the mouth or throat as needed for mouth pain. 06/11/21  Yes Rozalyn Osland R, NP  meloxicam (MOBIC) 15 MG tablet Take 1 tablet (15 mg total) by mouth daily. 04/16/21 04/16/22  Orvil Feil, PA-C    Family History Family History   Problem Relation Age of Onset   Diabetes Mother    Hypertension Mother    Diabetes Father    Hypertension Father     Social History Social History   Tobacco Use   Smoking status: Former    Types: Cigarettes    Quit date: 10/25/2014    Years since quitting: 6.6   Smokeless tobacco: Never  Vaping Use   Vaping Use: Never used  Substance Use Topics   Alcohol use: Yes    Comment: occasional glass of wine   Drug use: No     Allergies   Penicillins   Review of Systems Review of Systems  Constitutional: Negative.   HENT:  Positive for congestion, ear pain and postnasal drip. Negative for dental problem, drooling, ear discharge, facial swelling, hearing loss, mouth sores, nosebleeds, rhinorrhea, sinus pressure, sinus pain, sneezing, sore throat, tinnitus, trouble swallowing and voice change.   Respiratory:  Positive for cough. Negative for apnea, choking, chest tightness, shortness of breath, wheezing and stridor.   Cardiovascular: Negative.   Gastrointestinal: Negative.   Neurological: Negative.     Physical Exam Triage Vital Signs ED Triage Vitals  Enc Vitals Group     BP 06/11/21 0848 (!) 130/100     Pulse Rate 06/11/21 0848 (!) 103     Resp 06/11/21 0848 20     Temp 06/11/21 0848 98.4 F (36.9 C)     Temp src --      SpO2 06/11/21 0848 95 %     Weight --      Height --      Head Circumference --      Peak Flow --      Pain Score 06/11/21 0842 5     Pain Loc --      Pain Edu? --      Excl. in GC? --    No data found.  Updated Vital Signs BP (!) 130/100   Pulse (!) 103   Temp 98.4 F (36.9 C)   Resp 20   SpO2 95%   Visual Acuity Right Eye Distance:   Left Eye Distance:   Bilateral Distance:    Right Eye Near:   Left Eye Near:    Bilateral Near:     Physical Exam Constitutional:      Appearance: Normal appearance.  HENT:     Head: Normocephalic.     Right Ear: Tympanic membrane, ear canal and external ear normal.     Left Ear: Tympanic  membrane,  ear canal and external ear normal.     Nose: Congestion present. No rhinorrhea.     Mouth/Throat:     Mouth: Mucous membranes are moist.     Pharynx: Posterior oropharyngeal erythema present.  Eyes:     Extraocular Movements: Extraocular movements intact.  Cardiovascular:     Rate and Rhythm: Normal rate and regular rhythm.     Pulses: Normal pulses.     Heart sounds: Normal heart sounds.  Pulmonary:     Effort: Pulmonary effort is normal.     Breath sounds: Normal breath sounds.  Musculoskeletal:     Cervical back: Normal range of motion and neck supple.  Skin:    General: Skin is warm and dry.  Neurological:     Mental Status: She is alert and oriented to person, place, and time. Mental status is at baseline.  Psychiatric:        Mood and Affect: Mood normal.        Behavior: Behavior normal.     UC Treatments / Results  Labs (all labs ordered are listed, but only abnormal results are displayed) Labs Reviewed - No data to display  EKG   Radiology No results found.  Procedures Procedures (including critical care time)  Medications Ordered in UC Medications - No data to display  Initial Impression / Assessment and Plan / UC Course  I have reviewed the triage vital signs and the nursing notes.  Pertinent labs & imaging results that were available during my care of the patient were reviewed by me and considered in my medical decision making (see chart for details).  Allergies  Declined COVID and flu testing, low suspicion for infectious cause anyway, history of allergies, not currently taking medications, will manage conservatively and start patient on medications to treat symptoms, can follow-up with urgent care as needed  1.  Cetirizine 1 mg daily 2.  Flonase 50 mcg 1 spray each nare daily 3.  Lidocaine viscous 2% 15 mils every 4 hours as needed Final Clinical Impressions(s) / UC Diagnoses   Final diagnoses:  Seasonal allergies     Discharge  Instructions      I believe your symptoms today are possibly related to your allergies, I have low suspicion of an infectious cause  Begin use of cetirizine daily before bed  May use nasal spray each morning, point to help clear sinuses  Can use over-the-counter Mucinex as needed to help thin secretions  Can gargle and spit lidocaine solution every 4 hours as needed for temporary relief, can attempt use of salt water gargles, warm liquids and throat lozenges in addition to help with sore throat, can take over-the-counter ibuprofen or Tylenol in addition  May follow-up with urgent care as needed for persistent or worsening symptoms   ED Prescriptions     Medication Sig Dispense Auth. Provider   cetirizine (ZYRTEC ALLERGY) 10 MG tablet Take 1 tablet (10 mg total) by mouth daily. 30 tablet Chequita Mofield R, NP   lidocaine (XYLOCAINE) 2 % solution Use as directed 15 mLs in the mouth or throat as needed for mouth pain. 30 mL Keaston Pile R, NP   fluticasone (FLONASE) 50 MCG/ACT nasal spray Place 1 spray into both nostrils daily. 11.1 mL Valinda Hoar, NP      PDMP not reviewed this encounter.   Valinda Hoar, [place] 06/11/21 [phone]

## 2021-06-11 NOTE — Discharge Instructions (Signed)
I believe your symptoms today are possibly related to your allergies, I have low suspicion of an infectious cause  Begin use of cetirizine daily before bed  May use nasal spray each morning, point to help clear sinuses  Can use over-the-counter Mucinex as needed to help thin secretions  Can gargle and spit lidocaine solution every 4 hours as needed for temporary relief, can attempt use of salt water gargles, warm liquids and throat lozenges in addition to help with sore throat, can take over-the-counter ibuprofen or Tylenol in addition  May follow-up with urgent care as needed for persistent or worsening symptoms

## 2021-06-11 NOTE — ED Triage Notes (Signed)
Pt reports ear pain ,sore throat that started SAt.

## 2021-11-13 DIAGNOSIS — Z20822 Contact with and (suspected) exposure to covid-19: Secondary | ICD-10-CM | POA: Diagnosis not present

## 2021-11-13 DIAGNOSIS — R5383 Other fatigue: Secondary | ICD-10-CM | POA: Diagnosis not present

## 2022-01-10 ENCOUNTER — Ambulatory Visit (HOSPITAL_COMMUNITY)
Admission: EM | Admit: 2022-01-10 | Discharge: 2022-01-10 | Disposition: A | Discharge status: Home / Self Care | Payer: Medicaid Other | Attending: Internal Medicine | Admitting: Internal Medicine

## 2022-01-10 ENCOUNTER — Encounter (HOSPITAL_COMMUNITY): Payer: Self-pay

## 2022-01-10 DIAGNOSIS — J019 Acute sinusitis, unspecified: Secondary | ICD-10-CM | POA: Diagnosis not present

## 2022-01-10 DIAGNOSIS — B9689 Other specified bacterial agents as the cause of diseases classified elsewhere: Secondary | ICD-10-CM | POA: Diagnosis not present

## 2022-01-10 MED ORDER — SENNA 8.6 MG PO TABS: 1.0000 | ORAL_TABLET | Freq: Every day | ORAL | 0 refills | Status: DC | PRN

## 2022-01-10 MED ORDER — DOXYCYCLINE HYCLATE 100 MG PO CAPS: 100.0000 mg | ORAL_CAPSULE | Freq: Two times a day (BID) | ORAL | 0 refills | Status: AC

## 2022-01-10 MED ORDER — FLUTICASONE PROPIONATE 50 MCG/ACT NA SUSP: 1.0000 | Freq: Every day | NASAL | 0 refills | Status: DC

## 2022-01-10 MED ORDER — SALINE SPRAY 0.65 % NA SOLN: 1.0000 | NASAL | 0 refills | Status: DC | PRN

## 2022-01-10 MED ORDER — LORATADINE 10 MG PO TABS: 10.0000 mg | ORAL_TABLET | Freq: Every day | ORAL | 0 refills | Status: DC

## 2022-01-10 NOTE — ED Triage Notes (Signed)
Pt c/o productive cough with green sputum, runny nose, chest congestion, facial pressure, and back pain on inspiration x2wks intermittent. Taking OTC with relief.

## 2022-01-10 NOTE — Discharge Instructions (Addendum)
Saline nasal spray as needed Please take antibiotics as prescribed Use the medicated nasal spray If symptoms worsen please return to urgent care to be reevaluated.

## 2022-01-11 ENCOUNTER — Encounter: Payer: Self-pay | Admitting: Emergency Medicine

## 2022-01-11 ENCOUNTER — Other Ambulatory Visit: Payer: Self-pay

## 2022-01-11 ENCOUNTER — Emergency Department: Payer: Medicaid Other

## 2022-01-11 ENCOUNTER — Emergency Department
Admission: EM | Admit: 2022-01-11 | Discharge: 2022-01-11 | Disposition: A | Discharge status: Home / Self Care | Payer: Medicaid Other | Attending: Emergency Medicine | Admitting: Emergency Medicine

## 2022-01-11 DIAGNOSIS — R059 Cough, unspecified: Secondary | ICD-10-CM | POA: Insufficient documentation

## 2022-01-11 DIAGNOSIS — X58XXXA Exposure to other specified factors, initial encounter: Secondary | ICD-10-CM | POA: Insufficient documentation

## 2022-01-11 DIAGNOSIS — S29012A Strain of muscle and tendon of back wall of thorax, initial encounter: Secondary | ICD-10-CM | POA: Insufficient documentation

## 2022-01-11 DIAGNOSIS — M62830 Muscle spasm of back: Secondary | ICD-10-CM | POA: Insufficient documentation

## 2022-01-11 DIAGNOSIS — R0781 Pleurodynia: Secondary | ICD-10-CM | POA: Diagnosis not present

## 2022-01-11 DIAGNOSIS — I10 Essential (primary) hypertension: Secondary | ICD-10-CM | POA: Insufficient documentation

## 2022-01-11 DIAGNOSIS — S29002A Unspecified injury of muscle and tendon of back wall of thorax, initial encounter: Secondary | ICD-10-CM | POA: Diagnosis present

## 2022-01-11 LAB — URINALYSIS, ROUTINE W REFLEX MICROSCOPIC
Bilirubin Urine: NEGATIVE
Glucose, UA: NEGATIVE mg/dL
Hgb urine dipstick: NEGATIVE
Ketones, ur: NEGATIVE mg/dL
Nitrite: NEGATIVE
Protein, ur: NEGATIVE mg/dL
Specific Gravity, Urine: 1.024 (ref 1.005–1.030)
pH: 5 (ref 5.0–8.0)

## 2022-01-11 MED ORDER — LIDOCAINE 5 % EX PTCH: 1.0000 | MEDICATED_PATCH | Freq: Two times a day (BID) | CUTANEOUS | 0 refills | Status: AC | PRN

## 2022-01-11 MED ORDER — IBUPROFEN 800 MG PO TABS: 800.0000 mg | ORAL_TABLET | Freq: Three times a day (TID) | ORAL | 0 refills | Status: DC | PRN

## 2022-01-11 MED ORDER — CYCLOBENZAPRINE HCL 5 MG PO TABS: 5.0000 mg | ORAL_TABLET | Freq: Three times a day (TID) | ORAL | 0 refills | Status: DC | PRN

## 2022-01-11 NOTE — ED Triage Notes (Signed)
Pt to ED via POV stating that she has had cough for about 2 weeks. Pt seen at urgent care yesterday and given medication, pt states that she is feeling a little better today but when she is laying down and tries to get up she has back in her mid to upper back. Pt is in NAD.

## 2022-01-11 NOTE — ED Provider Notes (Signed)
[facility] Emergency Department Provider Note     Event Date/Time   First MD Initiated Contact with Patient 01/11/22 1112     (approximate)   History   Back Pain   HPI  Christine Parks is a 41 y.o. female with a history of hypertension presents to the ED for evaluation of back pain.  Patient has noted a cough for the last 2 weeks.  She was treated at an urgent care today, provided multiple medications including doxycycline, Flonase, and loratadine.  She presents today stating that overall she feels better but she has pain in the mid back when she transitions from supine to sit.  She localizes the pain to the right posterior lateral rib region.  She recognizes that pain has been there for about 3 days.  She denies any recent trauma.  She denies any fevers, shortness of breath, hemoptysis, or frank chest pain.     Physical Exam   Triage Vital Signs: ED Triage Vitals  Enc Vitals Group     BP 01/11/22 1045 (!) 152/87     Pulse Rate 01/11/22 1045 92     Resp 01/11/22 1045 16     Temp 01/11/22 1045 98.1 F (36.7 C)     Temp Source 01/11/22 1045 Oral     SpO2 01/11/22 1045 100 %     Weight --      Height --      Head Circumference --      Peak Flow --      Pain Score 01/11/22 1059 0     Pain Loc --      Pain Edu? --      Excl. in GC? --     Most recent vital signs: Vitals:   01/11/22 1045 01/11/22 1325  BP: (!) 152/87 (!) 150/80  Pulse: 92 90  Resp: 16 16  Temp: 98.1 F (36.7 C) 98 F (36.7 C)  SpO2: 100%     General Awake, no distress. NAD HEENT NCAT. PERRL. EOMI. No rhinorrhea. Mucous membranes are moist.  CV:  Good peripheral perfusion. RRR RESP:  Normal effort. CTA ABD:  No distention. Soft, nontender MSK:   Normal spinal alignment without midline tenderness, spasm, deformity, or step-off.  Patient with tenderness to palpation to the right thoracolumbar muscular region.  Pain is reproducible on exam with torso  maneuvering. SKIN:  No vesicular rash noted.   ED Results / Procedures / Treatments   Labs (all labs ordered are listed, but only abnormal results are displayed) Labs Reviewed  URINALYSIS, ROUTINE W REFLEX MICROSCOPIC - Abnormal; Notable for the following components:      Result Value   Color, Urine YELLOW (*)    APPearance CLOUDY (*)    Leukocytes,Ua SMALL (*)    Bacteria, UA RARE (*)    All other components within normal limits     EKG   RADIOLOGY  I personally viewed and evaluated these images as part of my medical decision making, as well as reviewing the written report by the radiologist.  ED Provider Interpretation: no acute findings}  DG Ribs Unilateral W/Chest Right  Result Date: 01/11/2022 CLINICAL DATA:  Rib pain, cough EXAM: RIGHT RIBS AND CHEST - 3+ VIEW COMPARISON:  None Available. FINDINGS: No right-sided rib fracture or other bone lesions are seen. There is no evidence of pneumothorax or pleural effusion. Both lungs are clear. Heart size and mediastinal contours are within normal limits. IMPRESSION: Negative. Electronically Signed  By: Duanne Guess D.O.   On: 01/11/2022 11:55     PROCEDURES:  Critical Care performed: No  Procedures   MEDICATIONS ORDERED IN ED: Medications - No data to display   IMPRESSION / MDM / ASSESSMENT AND PLAN / ED COURSE  I reviewed the triage vital signs and the nursing notes.                              Differential diagnosis includes, but is not limited to, CAP, rib fracture, thoracic muscle strain, shingles  Patient to the ED for evaluation of intermittent right-sided low back pain worsened with maneuvers.  Patient with current treatment for sinusitis.  Her exam is reassuring overall she has no radiologic signs of any acute pneumonia or acute rib fracture.  My review of images concurs with the radiologist report.  No evidence of UA on exam.  Patient's diagnosis is consistent with muscle strain to the thoracolumbar  region. Patient will be discharged home with prescriptions for Lidoderm patches and Flexeril. Patient is to follow up with primary provider as needed or otherwise directed. Patient is given ED precautions to return to the ED for any worsening or new symptoms.  Patient's presentation is most consistent with acute complicated illness / injury requiring diagnostic workup.  FINAL CLINICAL IMPRESSION(S) / ED DIAGNOSES   Final diagnoses:  Muscle spasm of back  Strain of thoracic back region     Rx / DC Orders   ED Discharge Orders          Ordered    cyclobenzaprine (FLEXERIL) 5 MG tablet  3 times daily PRN        01/11/22 1304    lidocaine (LIDODERM) 5 %  Every 12 hours PRN        01/11/22 1304    ibuprofen (ADVIL) 800 MG tablet  Every 8 hours PRN        01/11/22 1304             Note:  This document was prepared using Dragon voice recognition software and may include unintentional dictation errors.    Lissa Hoard, PA-C 01/11/22 1947    Delton Prairie, MD 01/20/22 2300

## 2022-01-11 NOTE — ED Provider Notes (Signed)
[facility]    CSN: [id number] Arrival date & time: 01/10/22  0830      History   Chief Complaint Chief Complaint  Patient presents with   Nasal Congestion    HPI Christine Parks is a 41 y.o. female comes to urgent care with a 2-week history of nasal congestion and productive of greenish sputum, facial pressure and chest congestion.  Patient describes pain in the chest and back associated with coughing.  No shortness of breath or wheezing.  No dizziness, near syncope or syncopal episodes.  No calf pain.  No sick contacts.  No recent long distance travel.  No falls or trauma.  HPI  Past Medical History:  Diagnosis Date   Abnormal EKG 01/15/2018   Bilateral leg edema 10/31/2016   Last Assessment & Plan:  Patient does have bilateral lower extremity edema she says that is coming from the verapamil 120 3 times a day since the dose is higher than her regular dose of 240 could be contributing for it we'll try to change her to Verapamil SR 240 mg a day and see how she does if she continues to have lower extremity edema we'll check echocardiogram.   Chest pain 06/28/2013   Last Assessment & Plan:  Patient reports intermittent chest pain accompanied by dyspnea and anxiety.  Her pain has pleuritic component to it.  However patient is unable to exercise the past several month due to complaints of exertional dyspnea.  She tells that she feels unsafe.  Therefore will investigate with exercise echocardiogram.   Dizziness 10/31/2016   Last Assessment & Plan:  As above.  We'll try to change her to Verapamil SR and see how she does if she continues to have problems then we will consider changing it.   Essential hypertension 10/31/2016   Last Assessment & Plan:  Patient with known hypertension did very well with the meropenem will SR 240 mg a day.  Her primary was giving her verapamil 120 mg 3 times a day and which is not helping her blood pressure on top of that she is having a lot of side  effects with the edema and dyspnea on exertion and palpitations.  She wants to get back on her regular where panel to 40 mg SR so we'll go ahe   Exposure to STD 08/18/2018   Hypertension    Palpitations 10/31/2016   Last Assessment & Plan:  Patient says that palpitations are also happening since she is on short-acting meropenem L plan as about we'll change it and really reevaluate how the palpitations are.   Vaginal dryness 08/18/2018   Visit for routine gyn exam 08/18/2018    Patient Active Problem List   Diagnosis Date Noted   Morbid obesity (HCC) 11/17/2020   Hypertension    Visit for routine gyn exam 08/18/2018   Exposure to STD 08/18/2018   Vaginal dryness 08/18/2018   Abnormal EKG 01/15/2018   Bilateral leg edema 10/31/2016   Dizziness 10/31/2016   Essential hypertension 10/31/2016   Palpitations 10/31/2016   Chest pain 06/28/2013    Past Surgical History:  Procedure Laterality Date   ABDOMINAL HYSTERECTOMY     CESAREAN SECTION  11/26/2001   CESAREAN SECTION  06/17/2004   LAPAROSCOPIC HYSTERECTOMY     TONSILLECTOMY      OB History     Gravida  4   Para      Term      Preterm      AB  Living  4      SAB      IAB      Ectopic      Multiple      Live Births  4            Home Medications    Prior to Admission medications   Medication Sig Start Date End Date Taking? Authorizing Provider  doxycycline (VIBRAMYCIN) 100 MG capsule Take 1 capsule (100 mg total) by mouth 2 (two) times daily for 7 days. 01/10/22 01/17/22 Yes Rhylen Shaheen, Myrene Galas, MD  fluticasone (FLONASE) 50 MCG/ACT nasal spray Place 1 spray into both nostrils daily. 01/10/22  Yes Allee Busk, Myrene Galas, MD  loratadine (CLARITIN) 10 MG tablet Take 1 tablet (10 mg total) by mouth daily. 01/10/22  Yes Karmela Bram, Myrene Galas, MD  senna (SENOKOT) 8.6 MG TABS tablet Take 1 tablet (8.6 mg total) by mouth daily as needed for up to 30 doses for mild constipation. 01/10/22  Yes Ozie Dimaria, Myrene Galas, MD  sodium chloride  (OCEAN) 0.65 % SOLN nasal spray Place 1 spray into both nostrils as needed for congestion. 01/10/22  Yes Xavien Dauphinais, Myrene Galas, MD  cyclobenzaprine (FLEXERIL) 5 MG tablet Take 1 tablet (5 mg total) by mouth 3 (three) times daily as needed. 01/11/22   Menshew, Dannielle Karvonen, PA-C  ibuprofen (ADVIL) 800 MG tablet Take 1 tablet (800 mg total) by mouth every 8 (eight) hours as needed. 01/11/22   Menshew, Dannielle Karvonen, PA-C  lidocaine (LIDODERM) 5 % Place 1 patch onto the skin every 12 (twelve) hours as needed for up to 10 days. Remove & Discard patch after 12 hours of wear each day. 01/11/22 01/21/22  Menshew, Dannielle Karvonen, PA-C  meloxicam (MOBIC) 15 MG tablet Take 1 tablet (15 mg total) by mouth daily. 04/16/21 04/16/22  Lannie Fields, PA-C    Family History Family History  Problem Relation Age of Onset   Diabetes Mother    Hypertension Mother    Diabetes Father    Hypertension Father     Social History Social History   Tobacco Use   Smoking status: Former    Types: Cigarettes    Quit date: 10/25/2014    Years since quitting: 7.2   Smokeless tobacco: Never  Vaping Use   Vaping Use: Never used  Substance Use Topics   Alcohol use: Yes    Comment: occasional glass of wine   Drug use: No     Allergies   Penicillins   Review of Systems Review of Systems  Constitutional: Negative.   HENT:  Positive for congestion, postnasal drip, sinus pressure and sinus pain. Negative for sneezing, sore throat, tinnitus and trouble swallowing.   Respiratory:  Positive for cough. Negative for shortness of breath and wheezing.   Genitourinary: Negative.   Musculoskeletal: Negative.   Neurological:  Positive for headaches. Negative for facial asymmetry.    Physical Exam Triage Vital Signs ED Triage Vitals [01/10/22 0904]  Enc Vitals Group     BP (!) 165/91     Pulse Rate (!) 108     Resp 18     Temp 98.6 F (37 C)     Temp Source Oral     SpO2 96 %     Weight      Height      Head Circumference       Peak Flow      Pain Score 8     Pain Loc  Pain Edu?      Excl. in [place]?    No data found.  Updated Vital Signs BP (!) 165/91 (BP Location: Right Arm)   Pulse (!) 108   Temp 98.6 F (37 C) (Oral)   Resp 18   SpO2 96%   Visual Acuity Right Eye Distance:   Left Eye Distance:   Bilateral Distance:    Right Eye Near:   Left Eye Near:    Bilateral Near:     Physical Exam Vitals and nursing note reviewed.  Constitutional:      General: She is not in acute distress.    Appearance: She is not ill-appearing.  HENT:     Right Ear: Tympanic membrane normal.     Left Ear: Tympanic membrane normal.     Mouth/Throat:     Mouth: Mucous membranes are moist.  Eyes:     Pupils: Pupils are equal, round, and reactive to light.  Cardiovascular:     Rate and Rhythm: Normal rate and regular rhythm.     Pulses: Normal pulses.     Heart sounds: Normal heart sounds.  Pulmonary:     Effort: Pulmonary effort is normal.     Breath sounds: Normal breath sounds. No wheezing or rhonchi.  Abdominal:     General: Bowel sounds are normal.  Musculoskeletal:        General: No swelling, tenderness or signs of injury. Normal range of motion.  Neurological:     Mental Status: She is alert.     UC Treatments / Results  Labs (all labs ordered are listed, but only abnormal results are displayed) Labs Reviewed - No data to display  EKG   Radiology DG Ribs Unilateral W/Chest Right  Result Date: 01/11/2022 CLINICAL DATA:  Rib pain, cough EXAM: RIGHT RIBS AND CHEST - 3+ VIEW COMPARISON:  None Available. FINDINGS: No right-sided rib fracture or other bone lesions are seen. There is no evidence of pneumothorax or pleural effusion. Both lungs are clear. Heart size and mediastinal contours are within normal limits. IMPRESSION: Negative. Electronically Signed   By: Davina Poke D.O.   On: 01/11/2022 11:55    Procedures Procedures (including critical care time)  Medications Ordered in  UC Medications - No data to display  Initial Impression / Assessment and Plan / UC Course  I have reviewed the triage vital signs and the nursing notes.  Pertinent labs & imaging results that were available during my care of the patient were reviewed by me and considered in my medical decision making (see chart for details).     1.  Acute bacterial sinusitis: Saline nasal spray Doxycycline 1 tablet twice daily for 5 days Fluticasone nasal spray Claritin 1 tablet daily Humidifier and vapor rub use is recommended Tylenol/Motrin as needed for pain and/or fever Return to urgent care if symptoms worsen Patient complained of constipation-senna prescribed. Final Clinical Impressions(s) / UC Diagnoses   Final diagnoses:  Acute bacterial sinusitis     Discharge Instructions      Saline nasal spray as needed Please take antibiotics as prescribed Use the medicated nasal spray If symptoms worsen please return to urgent care to be reevaluated.   ED Prescriptions     Medication Sig Dispense Auth. Provider   doxycycline (VIBRAMYCIN) 100 MG capsule Take 1 capsule (100 mg total) by mouth 2 (two) times daily for 7 days. 14 capsule Meagen Limones, Myrene Galas, MD   fluticasone (FLONASE) 50 MCG/ACT nasal spray Place 1 spray into both nostrils  daily. 16 g Alan Drummer, Myrene Galas, MD   loratadine (CLARITIN) 10 MG tablet Take 1 tablet (10 mg total) by mouth daily. 30 tablet Ifeoluwa Beller, Myrene Galas, MD   senna (SENOKOT) 8.6 MG TABS tablet Take 1 tablet (8.6 mg total) by mouth daily as needed for up to 30 doses for mild constipation. 30 tablet Jemarcus Dougal, Myrene Galas, MD   sodium chloride (OCEAN) 0.65 % SOLN nasal spray Place 1 spray into both nostrils as needed for congestion. -- Chase Picket, MD      PDMP not reviewed this encounter.   Chase Picket, MD 01/11/22 1750

## 2022-01-11 NOTE — ED Notes (Signed)
Seen at [facility] yesterday for stuffy nose, head congestion.  She was put on doxycycline, flonase and loratidine.  Says she is here because of severe pain that awakens her.  It is in upper back and occurs with deep breath.  No distress, ambulatory to room.

## 2022-01-11 NOTE — Discharge Instructions (Signed)
Take the meds as directed. Follow-up with your provider as needed.

## 2022-01-14 ENCOUNTER — Telehealth: Payer: Self-pay

## 2022-01-14 NOTE — Telephone Encounter (Signed)
Transition Care Management Follow-up Telephone Call Date of discharge and from where: 01/11/2022 from [facility] How have you been since you were released from the hospital? Patient stated that she is feeling about the same. Patient has started the cyclobenzaprine.  Any questions or concerns? No  Items Reviewed: Did the pt receive and understand the discharge instructions provided? Yes  Medications obtained and verified? Yes  Other? No  Any new allergies since your discharge? No  Dietary orders reviewed? No Do you have support at home? Yes   Functional Questionnaire: (I = Independent and D = Dependent) ADLs: I  Bathing/Dressing- I  Meal Prep- I  Eating- I  Maintaining continence- I  Transferring/Ambulation- I  Managing Meds- I  Follow up appointments reviewed:  PCP Hospital f/u appt confirmed? Yes  Scheduled to see Alfredo Martinez, MD on 01/25/2022 @ 9:15am. Specialist Hospital f/u appt confirmed? No   Are transportation arrangements needed? No  If their condition worsens, is the pt aware to call PCP or go to the Emergency Dept.? Yes Was the patient provided with contact information for the PCP's office or ED? Yes Was to pt encouraged to call back with questions or concerns? Yes

## 2022-01-25 ENCOUNTER — Ambulatory Visit (INDEPENDENT_AMBULATORY_CARE_PROVIDER_SITE_OTHER): Payer: Medicaid Other | Admitting: Student

## 2022-01-25 ENCOUNTER — Encounter: Payer: Self-pay | Admitting: Student

## 2022-01-25 VITALS — BP 137/87 | HR 98 | Ht 64.0 in | Wt 282.2 lb

## 2022-01-25 DIAGNOSIS — I10 Essential (primary) hypertension: Secondary | ICD-10-CM

## 2022-01-25 DIAGNOSIS — R0781 Pleurodynia: Secondary | ICD-10-CM | POA: Diagnosis not present

## 2022-01-25 DIAGNOSIS — R9431 Abnormal electrocardiogram [ECG] [EKG]: Secondary | ICD-10-CM | POA: Diagnosis not present

## 2022-01-25 DIAGNOSIS — R634 Abnormal weight loss: Secondary | ICD-10-CM

## 2022-01-25 DIAGNOSIS — R052 Subacute cough: Secondary | ICD-10-CM | POA: Insufficient documentation

## 2022-01-25 MED ORDER — TOPIRAMATE 50 MG PO TABS: 50.0000 mg | ORAL_TABLET | Freq: Every day | ORAL | 0 refills | Status: DC

## 2022-01-25 MED ORDER — LIDOCAINE 5 % EX PTCH: 1.0000 | MEDICATED_PATCH | CUTANEOUS | 0 refills | Status: DC

## 2022-01-25 MED ORDER — LORATADINE 10 MG PO TABS: 10.0000 mg | ORAL_TABLET | Freq: Every day | ORAL | 0 refills | Status: DC

## 2022-01-25 NOTE — Assessment & Plan Note (Signed)
Sees cardiology outpatient.  No chest pain today.

## 2022-01-25 NOTE — Patient Instructions (Addendum)
It was great to see you today! Thank you for choosing [facility] for your primary care. Christine Parks was seen for new patient appt.  Today we addressed: Referral to nutrition, we will call you with that appointment We will try Topamax 50 mg once daily for the next couple of weeks to see how you tolerate it and then will probably increase dose if tolerated well I am going to check some labs today checking her A1c and lipid panel I am going to give you a prescription for lidocaine patches to take every 12 hours when you have the rib pain Please take Claritin and Flonase as prescribed and return to see me in 2 to 3 weeks for reassessment of blood pressure and cough  Orders Placed This Encounter  Procedures   Hemoglobin A1c   Lipid Panel   Ambulatory referral to Nutrition and Diabetic Education    Referral Priority:   Routine    Referral Type:   Consultation    Referral Reason:   Specialty Services Required    Number of Visits Requested:   1   Meds ordered this encounter  Medications   lidocaine (LIDODERM) 5 %    Sig: Place 1 patch onto the skin daily. Remove & Discard patch within 12 hours or as directed by MD    Dispense:  10 patch    Refill:  0   topiramate (TOPAMAX) 50 MG tablet    Sig: Take 1 tablet (50 mg total) by mouth daily.    Dispense:  30 tablet    Refill:  0   loratadine (CLARITIN) 10 MG tablet    Sig: Take 1 tablet (10 mg total) by mouth daily.    Dispense:  30 tablet    Refill:  0    If you haven't already, sign up for My Chart to have easy access to your labs results, and communication with your primary care physician.  We are checking some labs today. If they are abnormal, I will call you. If they are normal, I will send you a MyChart message (if it is active) or a letter in the mail. If you do not hear about your labs in the next 2 weeks, please call the office.   You should return to our clinic No follow-ups on file.  I recommend that you  always bring your medications to each appointment as this makes it easy to ensure you are on the correct medications and helps [place] not miss refills when you need them.  Please arrive 15 minutes before your appointment to ensure smooth check in process.  We appreciate your efforts in making this happen.  Please call the clinic at [phone] if your symptoms worsen or you have any concerns.  Thank you for allowing me to participate in your care, Makena Mcgrady [employer]

## 2022-01-25 NOTE — Assessment & Plan Note (Signed)
Patient with an intermittent cough for approximately 3 weeks.  Does not appear that the doxycycline resolved her symptoms.  We will continue with Claritin and Flonase to assist with allergies and recheck her in a couple weeks.  If she continues to remain symptomatic, will consider other options.

## 2022-01-25 NOTE — Progress Notes (Signed)
Subjective:    Patient ID: Christine Parks, female    DOB: [birth year], 41 y.o.   MRN: [id number]   CC: Establish care  HPI:  Christine Parks is a very pleasant 41 y.o. female who presents today to establish care.  Initial concerns: - Sees cardiology with HTN  - Had previously been on Verapamil; did not like the medication side effects  - She does not endorse taking this medication as prescribed due to the side effects  -Does not check blood pressure at home. -Denies any SOB, CP, vision changes, LE edema, HA or symptoms of hypotension.  -Diet includes salad for lunch and dinner consists of baked chicken or broccoli  -Low salt intake on a daily basis -Chews gum and water to help with cravings -Exercise: Tuesdays and Thursdays exercises and she also works with Systems analyst   Most recent creatinine trend:  Lab Results  Component Value Date   CREATININE 0.75 01/06/2018   CREATININE 0.77 12/17/2016   CREATININE 0.81 10/24/2016   Patient has not had a BMP in the past 1 year.  Weight Loss:  - Sees bariatric clinic - diet pills - Diethylpropion but she has not taken this for a few weeks now - Lost almost 30 lbs since February  - ACG and B12 shots through weight loss clinic  -See diet and exercise as above  Cough:  Patient complains of productive cough.   Symptoms began 4 days ago. Doxy was prescribed and helped with the cough for sinusitis for about one week, has tried mucinex as well.  The initial cough began 3 weeks ago that seemed to resolve with doxycycline for about 1 week until symptoms recurred 4 days ago. The cough is productive of clear sputum, productive of green/yellow sputum, waxing and waning over time and is aggravated by cold air  The cough wakes her at night  Associated symptoms include:sputum production.  Patient does not have new pets.  Patient does not have a history of asthma.  Patient does not have a history of environmental allergens.  Patient  does not have recent travel.  Patient does have a history of smoking, about 6 years ago, stopped vaping about 3 weeks ago   Patient  has previous Chest X-ray.  Patient has not had a PPD done.  Past medical history: H/o HTN   Past surgical history: Past Surgical History:  Procedure Laterality Date   ABDOMINAL HYSTERECTOMY     CESAREAN SECTION  11/26/2001   CESAREAN SECTION  06/17/2004   LAPAROSCOPIC HYSTERECTOMY     TONSILLECTOMY      Current medications: Flonase  Family history:  Family History  Problem Relation Age of Onset   Diabetes Mother    Hypertension Mother    Diabetes Father    Hypertension Father      Social history: As noted above patient was vaping until approximately 3 weeks ago and had smoked prior to this.  She does not drink alcohol frequently or socially and does not have any history of illicit drug use.  ROS: pertinent noted in the HPI   Objective:  BP 137/87   Pulse 98   Ht 5\' 4"  (1.626 m)   Wt 282 lb 3.2 oz (128 kg)   SpO2 100%   BMI 48.44 kg/m   Vitals and nursing note reviewed  General: NAD, pleasant, able to participate in exam; pleasant, obese, AA female HEENT: Able to visualize back of throat, no erythematous lesions or drainage, scant lymphadenopathy Cardiac:  RRR, S1 S2 present. normal heart sounds, no murmurs. Respiratory: CTAB, normal effort, No wheezes, rales or rhonchi Abdomen: Bowel sounds present, non-tender, non-distended, no hepatosplenomegaly Extremities: no edema or cyanosis. Skin: warm and dry, no rashes noted Neuro: alert, no obvious focal deficits Psych: Normal affect and mood   Assessment & Plan:    Essential hypertension Patient has been working diligently on weight loss and has seen benefit from this.  This may be contributing to her improvement of blood pressure as noted.  She is no longer taking verapamil.  I would like to monitor her blood pressures without restarting an antihypertensive.  Patient is asymptomatic  and will return in few weeks to reassess blood pressure.  Try to avoid stimulant medications.  Morbid obesity (HCC) Patient has lost upwards towards 30 pounds since February 2023 with strict diet regimen and exercise.  She is doing quite well and I commended her on her progress thus far.  She would like to transition from the bariatric clinic to [facility] for assistance with weight loss.  We will continue with a nutrition appointment here with Dr. Gerilyn Pilgrim.  Additionally, I have started her on Topamax at a lower dose of 50 mg to see how she tolerates it.  She will be able to go up on this in a couple of weeks to 100 mg if she tolerates it well.  If this is not helpful for her, we can consider other options of weight loss medications.  We have checked an A1c to ensure patient is not diabetic and we will recheck a lipid panel to evaluate for hyperlipidemia.  Abnormal EKG Sees cardiology outpatient.  No chest pain today.  Subacute cough Patient with an intermittent cough for approximately 3 weeks.  Does not appear that the doxycycline resolved her symptoms.  We will continue with Claritin and Flonase to assist with allergies and recheck her in a couple weeks.  If she continues to remain symptomatic, will consider other options.  No signs of depressive symptoms on exam today with appropriate mood and affect.  We will likely need to obtain HIV, hep C, and Pap smear if not already done the appropriate timeframe.  Alfredo Martinez, MD  [facility] Family [facility] PGY-1

## 2022-01-25 NOTE — Assessment & Plan Note (Signed)
Patient has been working diligently on weight loss and has seen benefit from this.  This may be contributing to her improvement of blood pressure as noted.  She is no longer taking verapamil.  I would like to monitor her blood pressures without restarting an antihypertensive.  Patient is asymptomatic and will return in few weeks to reassess blood pressure.  Try to avoid stimulant medications.

## 2022-01-25 NOTE — Assessment & Plan Note (Signed)
Patient has lost upwards towards 30 pounds since February 2023 with strict diet regimen and exercise.  She is doing quite well and I commended her on her progress thus far.  She would like to transition from the bariatric clinic to [facility] for assistance with weight loss.  We will continue with a nutrition appointment here with Dr. Gerilyn Pilgrim.  Additionally, I have started her on Topamax at a lower dose of 50 mg to see how she tolerates it.  She will be able to go up on this in a couple of weeks to 100 mg if she tolerates it well.  If this is not helpful for her, we can consider other options of weight loss medications.  We have checked an A1c to ensure patient is not diabetic and we will recheck a lipid panel to evaluate for hyperlipidemia.

## 2022-01-26 LAB — LIPID PANEL
Chol/HDL Ratio: 4.1 ratio (ref 0.0–4.4)
Cholesterol, Total: 171 mg/dL (ref 100–199)
HDL: 42 mg/dL (ref >39)
LDL Chol Calc (NIH): 118 mg/dL — ABNORMAL HIGH (ref 0–99)
Triglycerides: 58 mg/dL (ref 0–149)
VLDL Cholesterol Cal: 11 mg/dL (ref 5–40)

## 2022-01-26 LAB — HEMOGLOBIN A1C
Est. average glucose Bld gHb Est-mCnc: 117 mg/dL
Hgb A1c MFr Bld: 5.7 % — ABNORMAL HIGH (ref 4.8–5.6)

## 2022-01-28 ENCOUNTER — Telehealth: Payer: Self-pay

## 2022-01-28 NOTE — Telephone Encounter (Signed)
Patient calls nurse line requesting to speak with Dr. Jena Gauss regarding follow up questions with lab results.   Patient is requesting returned phone call at [phone].   Veronda Prude, RN

## 2022-01-29 ENCOUNTER — Other Ambulatory Visit: Payer: Self-pay | Admitting: Student

## 2022-01-29 DIAGNOSIS — E8881 Metabolic syndrome: Secondary | ICD-10-CM

## 2022-01-29 DIAGNOSIS — I10 Essential (primary) hypertension: Secondary | ICD-10-CM

## 2022-01-29 DIAGNOSIS — R7303 Prediabetes: Secondary | ICD-10-CM

## 2022-01-29 MED ORDER — WEGOVY 0.25 MG/0.5ML ~~LOC~~ SOAJ: 0.2500 mg | SUBCUTANEOUS | 0 refills | Status: DC

## 2022-01-29 NOTE — Progress Notes (Signed)
Patient would like to try [facility] for weight loss assistance if able. Currently taking topamax. Will place order and see if covered by insurance.    Christine Martinez, MD

## 2022-02-08 ENCOUNTER — Ambulatory Visit: Payer: Medicaid Other | Admitting: Student

## 2022-02-13 NOTE — Progress Notes (Signed)
SUBJECTIVE:   CHIEF COMPLAINT / HPI:   HTN: On no medications and was well controlled with diet and exercises on last visit   Weight Loss: Patient has been taking topamax; she endorses frustration with her lack of ability to lose weight. She had a death in the family recently and has been travelling over the last couple of weeks but endorses that she sticks to her diet and generally exercises regularly.   Normal Day for patient:  She drinks her water and MVM and intermittently fasts after 10 AM Eats protein bar or early salad  At 12PM, eats lunch with baked chicken, brocoli, carrots and still drinking water Apple cidar vinegar shots twice daily  Does not eat after six pm  Switched to [employer] today and is no longer seeing her personal trainer but is excited about the possibility of a new gym.    PERTINENT  PMH / PSH:   Past Medical History:  Diagnosis Date   Abnormal EKG 01/15/2018   Bilateral leg edema 10/31/2016   Last Assessment & Plan:  Patient does have bilateral lower extremity edema she says that is coming from the verapamil 120 3 times a day since the dose is higher than her regular dose of 240 could be contributing for it we'll try to change her to Verapamil SR 240 mg a day and see how she does if she continues to have lower extremity edema we'll check echocardiogram.   Chest pain 06/28/2013   Last Assessment & Plan:  Patient reports intermittent chest pain accompanied by dyspnea and anxiety.  Her pain has pleuritic component to it.  However patient is unable to exercise the past several month due to complaints of exertional dyspnea.  She tells that she feels unsafe.  Therefore will investigate with exercise echocardiogram.   Dizziness 10/31/2016   Last Assessment & Plan:  As above.  We'll try to change her to Verapamil SR and see how she does if she continues to have problems then we will consider changing it.   Essential hypertension 10/31/2016   Last Assessment &  Plan:  Patient with known hypertension did very well with the meropenem will SR 240 mg a day.  Her primary was giving her verapamil 120 mg 3 times a day and which is not helping her blood pressure on top of that she is having a lot of side effects with the edema and dyspnea on exertion and palpitations.  She wants to get back on her regular where panel to 40 mg SR so we'll go ahe   Exposure to STD 08/18/2018   Hypertension    Palpitations 10/31/2016   Last Assessment & Plan:  Patient says that palpitations are also happening since she is on short-acting meropenem L plan as about we'll change it and really reevaluate how the palpitations are.   Vaginal dryness 08/18/2018   Visit for routine gyn exam 08/18/2018    OBJECTIVE:  BP (!) 138/95   Pulse 89   Wt 282 lb (127.9 kg)   SpO2 100%   BMI 48.41 kg/m   General: NAD, pleasant, able to participate in exam; AA  young adult F Cardiac: RRR, no murmurs auscultated Respiratory: CTAB, normal WOB Abdomen: soft, non-tender, non-distended, normoactive bowel sounds Extremities: warm and well perfused, no edema or cyanosis Skin: warm and dry, no rashes noted Neuro: alert, no obvious focal deficits, speech normal Psych: Normal affect and mood  ASSESSMENT/PLAN:  Essential hypertension Currently not medicated, will recheck this  on next visit. Suffers from white coat HTN but may benefit from manual and possible medication on next check if continues to remain high.   Morbid obesity (HCC) Victoza ordered for prediabetes and metabolic syndrome. Patient will see nutrition today and continue with diet and exercise. She has seemed to plateau but will work on Psychiatrist with nutrition as well, will follow up on this next visit. No h/o pancreatitis.    No orders of the defined types were placed in this encounter.  Meds ordered this encounter  Medications   liraglutide (VICTOZA) 18 MG/3ML SOPN    Sig: Inject 0.6 mg into the skin daily. 0.6 mg once daily for 1  week,then increase to 1.2 mg once daily,max 1.8  mg    Dispense:  3 mL    Refill:  1   Return in about 4 weeks (around 03/14/2022) for Weight management.  Per care gaps: Needs HIV, Hep C, Pap smear, TDAP. Will check this on next visit- agrees to pap on next visit.   Alfredo Martinez, MD PGY-2 Family Medicine

## 2022-02-14 ENCOUNTER — Ambulatory Visit (INDEPENDENT_AMBULATORY_CARE_PROVIDER_SITE_OTHER): Payer: Medicaid Other | Admitting: Student

## 2022-02-14 ENCOUNTER — Ambulatory Visit (INDEPENDENT_AMBULATORY_CARE_PROVIDER_SITE_OTHER): Payer: Medicaid Other | Admitting: Family Medicine

## 2022-02-14 ENCOUNTER — Encounter: Payer: Self-pay | Admitting: Student

## 2022-02-14 VITALS — BP 138/95 | HR 89 | Wt 282.0 lb

## 2022-02-14 DIAGNOSIS — R7303 Prediabetes: Secondary | ICD-10-CM

## 2022-02-14 DIAGNOSIS — E8881 Metabolic syndrome: Secondary | ICD-10-CM | POA: Diagnosis not present

## 2022-02-14 DIAGNOSIS — I10 Essential (primary) hypertension: Secondary | ICD-10-CM

## 2022-02-14 MED ORDER — LIRAGLUTIDE 18 MG/3ML ~~LOC~~ SOPN: 0.6000 mg | PEN_INJECTOR | Freq: Every day | SUBCUTANEOUS | 1 refills | Status: DC

## 2022-02-14 NOTE — Patient Instructions (Addendum)
You are doing good with making sure that you get protein, we want to make sure you're getting protein at least 3 times per day. Protein shakes are okay, but we would prefer to think of those as back-up and get most of our nutrients and protein from real food.   Make sure to eat a food fiber diet, vegetables are a great source and help with our gut microbiome healthy. You can take a probiotic if you would like, but we don't know necessarily what the best ones are for our bodies.  Our goals from today: - 2 vegetable servings per day - 1 fruit serving per day - Exercise at least 30 minutes 3 times per week  Feel free if you are achieving these goals, to either increase these or change the goals all together. The goals are towards behavioral changes that are SMART (Specific, Measurable, Action-oriented, Realistic, Time-based).

## 2022-02-14 NOTE — Patient Instructions (Addendum)
It was great to see you today! Thank you for choosing [facility] for your primary care. Christine Parks was seen for follow up.  Today we addressed: Your blood pressure looks OK, we will continue monitoring  Keep up the good work with diet and exercise Keep me updated on the authorization of the medication You will take the victoza at 0.6 for one week and monitor for symptoms of nausea and vomiting  We will need to get a pap smear soon as well    No orders of the defined types were placed in this encounter.  Meds ordered this encounter  Medications   liraglutide (VICTOZA) 18 MG/3ML SOPN    Sig: Inject 0.6 mg into the skin daily. 0.6 mg once daily for 1 week,then increase to 1.2 mg once daily,max 1.8  mg    Dispense:  3 mL    Refill:  1    If you haven't already, sign up for My Chart to have easy access to your labs results, and communication with your primary care physician.  You should return to our clinic Return in about 4 weeks (around 03/14/2022) for Weight management.  I recommend that you always bring your medications to each appointment as this makes it easy to ensure you are on the correct medications and helps [place] not miss refills when you need them.  Please arrive 15 minutes before your appointment to ensure smooth check in process.  We appreciate your efforts in making this happen.  Please call the clinic at [phone] if your symptoms worsen or you have any concerns.  Thank you for allowing me to participate in your care, Alfredo Martinez, MD PGY-2 Family Medicine

## 2022-02-14 NOTE — Progress Notes (Signed)
What do you want to get out of meeting with me today? Knowledge for weight loss and how to maintain it.  Relevant history/background: Prediabetes, HTN, morbid obesity (followed a bariatric clinic) She was previously following with bariatric clinic and was getting hcg shots at that time, but stopped them due to mood swings. She had also been doing "mic shots" which include B12 and lipotrophic but stopped those.  She was previously going to the gym every day and had a personal trainer but that was too expensive and the personal trainer was being a bit too personal. She is now going about 2 times per week and is there for about an hour (doing cardio and some body weight training) Has been doing intermittent fasting for the last 3-4 weeks. Her eating window is from 10am-6pm.  Assessment:  On a normal day, how many meals do you have? 2 (lunch, dinner) does intermittent fasting  How many snacks do you eat per day? 1. What beverages do you drink most days of the week? Water, carmel probiotic water once a day.   What foods do you eat most days of the week? Salad with protein in it.   Usual physical activity: 2 times per week Sleep: Patient estimates average of 8 hours of sleep/night.  24-hr recall:  (Up at 6 AM)- none B ( AM)- water  and vitamins Snk ( AM)- none  L (12 PM)- salad with protein  Snk (2 PM)- protein bar (Atkin's nut-fudge bar)  D (530 PM)- salmon (about 6-8oz)  Typical day? Yes.     Intervention: Completed diet and exercise history, and established: - SMART behavioral goals (Specific, Measurable, Action-oriented, Realistic, Time-based.)    1. Three times per week at the gym for at least 30 minutes   2. Eat vegetables at least twice per day   3. 1 serving of fruit per day  - How will you document progress on goals?  Goals sheet.   For recommendations and goals, see Patient Instructions.    Follow-up: 8 weeks.    Luverne Farone

## 2022-02-15 ENCOUNTER — Telehealth: Payer: Self-pay

## 2022-02-15 ENCOUNTER — Encounter: Payer: Self-pay | Admitting: Student

## 2022-02-15 NOTE — Telephone Encounter (Signed)
A Prior Authorization was initiated for this patients VICTOZA through CoverMyMeds.   Key:  [id number]

## 2022-02-15 NOTE — Assessment & Plan Note (Signed)
Victoza ordered for prediabetes and metabolic syndrome. Patient will see nutrition today and continue with diet and exercise. She has seemed to plateau but will work on Psychiatrist with nutrition as well, will follow up on this next visit. No h/o pancreatitis.

## 2022-02-15 NOTE — Assessment & Plan Note (Signed)
Currently not medicated, will recheck this on next visit. Suffers from white coat HTN but may benefit from manual and possible medication on next check if continues to remain high.

## 2022-02-18 NOTE — Telephone Encounter (Signed)
Prior Auth for patients medication VICTOZA denied by [place] via CoverMyMeds.   Reason: MEDICAL CRITERIA NOT MET. DRUG IS COVERED IF THE FOLLOWING IS MET:  (1) You have a disease with high blood sugars (diabetes) and one of the following:  (A) A type of heart disease (B) A type of kidney disease  (2) You have tried or cannot use one metformin containing drug.  CoverMyMeds Key:  [id number]  Denial letter scanned to chart

## 2022-02-19 ENCOUNTER — Encounter: Payer: Self-pay | Admitting: Student

## 2022-02-21 ENCOUNTER — Other Ambulatory Visit: Payer: Self-pay | Admitting: Student

## 2022-02-21 DIAGNOSIS — R634 Abnormal weight loss: Secondary | ICD-10-CM

## 2022-03-15 ENCOUNTER — Encounter: Payer: Self-pay | Admitting: Student

## 2022-03-15 DIAGNOSIS — R52 Pain, unspecified: Secondary | ICD-10-CM

## 2022-03-16 MED ORDER — IBUPROFEN 200 MG PO TABS: 200.0000 mg | ORAL_TABLET | Freq: Four times a day (QID) | ORAL | 0 refills | Status: DC | PRN

## 2022-03-20 ENCOUNTER — Ambulatory Visit: Payer: Medicaid Other | Admitting: Student

## 2022-03-20 NOTE — Progress Notes (Deleted)
  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   Chest pain 06/28/2013   Last Assessment & Plan:  Patient reports intermittent chest pain accompanied by dyspnea and anxiety.  Her pain has pleuritic component to it.  However patient is unable to exercise the past several month due to complaints of exertional dyspnea.  She tells that she feels unsafe.  Therefore will investigate with exercise echocardiogram.   Dizziness 10/31/2016   Last Assessment & Plan:  As above.  We'll try to change her to Verapamil SR and see how she does if she continues to have problems then we will consider changing it.   Essential hypertension 10/31/2016   Last Assessment & Plan:  Patient with known hypertension did very well with the meropenem will SR 240 mg a day.  Her primary was giving her verapamil 120 mg 3 times a day and which is not helping her blood pressure on top of that she is having a lot of side effects with the edema and dyspnea on exertion and palpitations.  She wants to get back on her regular where panel to 40 mg SR so we'll go ahe   Exposure to STD 08/18/2018   Hypertension    Palpitations 10/31/2016   Last Assessment & Plan:  Patient says that  palpitations are also happening since she is on short-acting meropenem L plan as about we'll change it and really reevaluate how the palpitations are.   Vaginal dryness 08/18/2018   Visit for routine gyn exam 08/18/2018    OBJECTIVE:  There were no vitals taken for this visit.  General: NAD, pleasant, able to participate in exam Cardiac: RRR, no murmurs auscultated Respiratory: CTAB, normal WOB Abdomen: soft, non-tender, non-distended, normoactive bowel sounds Extremities: warm and well perfused, no edema or cyanosis Skin: warm and dry, no rashes noted Neuro: alert, no obvious focal deficits, speech normal Psych: Normal affect and mood  ASSESSMENT/PLAN:  No problem-specific Assessment & Plan notes found for this encounter.   No orders of the defined types were placed in this encounter.  No orders of the defined types were placed in this encounter.  No follow-ups on file. Alfredo Martinez, MD 03/20/2022, 8:03 AM PGY-2, [facility] {    This will disappear when note is signed, click to select method of visit    :1}

## 2022-03-27 ENCOUNTER — Ambulatory Visit (INDEPENDENT_AMBULATORY_CARE_PROVIDER_SITE_OTHER): Payer: Medicaid Other | Admitting: Student

## 2022-03-27 ENCOUNTER — Encounter: Payer: Self-pay | Admitting: Student

## 2022-03-27 VITALS — BP 132/80 | HR 82 | Temp 98.0°F | Ht 64.0 in | Wt 279.0 lb

## 2022-03-27 DIAGNOSIS — E786 Lipoprotein deficiency: Secondary | ICD-10-CM | POA: Insufficient documentation

## 2022-03-27 DIAGNOSIS — E8881 Metabolic syndrome: Secondary | ICD-10-CM | POA: Insufficient documentation

## 2022-03-27 DIAGNOSIS — E559 Vitamin D deficiency, unspecified: Secondary | ICD-10-CM | POA: Insufficient documentation

## 2022-03-27 DIAGNOSIS — E88819 Insulin resistance, unspecified: Secondary | ICD-10-CM | POA: Insufficient documentation

## 2022-03-27 DIAGNOSIS — Z780 Asymptomatic menopausal state: Secondary | ICD-10-CM | POA: Insufficient documentation

## 2022-03-27 DIAGNOSIS — Z789 Other specified health status: Secondary | ICD-10-CM | POA: Insufficient documentation

## 2022-03-27 DIAGNOSIS — I10 Essential (primary) hypertension: Secondary | ICD-10-CM

## 2022-03-27 DIAGNOSIS — Z6841 Body Mass Index (BMI) 40.0 and over, adult: Secondary | ICD-10-CM | POA: Diagnosis not present

## 2022-03-27 NOTE — Progress Notes (Unsigned)
SUBJECTIVE:   CHIEF COMPLAINT / HPI:   Hypertension: BP: 132/80 today. Home medications include: None. She endorses taking these medications as prescribed. Does check blood pressure at home. Diet: Chia seed with oats. Exercise: continues with [employer]. Takes 2 Censor pills in the morning.   Needs to increase water intake   Most recent creatinine trend:  Lab Results  Component Value Date   CREATININE 0.75 01/06/2018   CREATININE 0.77 12/17/2016   CREATININE 0.81 10/24/2016   Patient has not had a BMP in the past 1 year.  [facility]/22 and was rx Verapamil for blood pressure per chart review.     PERTINENT  PMH / PSH:   Past Medical History:  Diagnosis Date   Abnormal EKG 01/15/2018   Bilateral leg edema 10/31/2016   Last Assessment & Plan:  Patient does have bilateral lower extremity edema she says that is coming from the verapamil 120 3 times a day since the dose is higher than her regular dose of 240 could be contributing for it we'll try to change her to Verapamil SR 240 mg a day and see how she does if she continues to have lower extremity edema we'll check echocardiogram.   Chest pain 06/28/2013   Last Assessment & Plan:  Patient reports intermittent chest pain accompanied by dyspnea and anxiety.  Her pain has pleuritic component to it.  However patient is unable to exercise the past several month due to complaints of exertional dyspnea.  She tells that she feels unsafe.  Therefore will investigate with exercise echocardiogram.   Dizziness 10/31/2016   Last Assessment & Plan:  As above.  We'll try to change her to Verapamil SR and see how she does if she continues to have problems then we will consider changing it.   Essential hypertension 10/31/2016   Last Assessment & Plan:  Patient with known hypertension did very well with the meropenem will SR 240 mg a day.  Her primary was giving her verapamil 120 mg 3 times a day and which is not helping her blood  pressure on top of that she is having a lot of side effects with the edema and dyspnea on exertion and palpitations.  She wants to get back on her regular where panel to 40 mg SR so we'll go ahe   Exposure to STD 08/18/2018   Hypertension    Palpitations 10/31/2016   Last Assessment & Plan:  Patient says that palpitations are also happening since she is on short-acting meropenem L plan as about we'll change it and really reevaluate how the palpitations are.   Vaginal dryness 08/18/2018   Visit for routine gyn exam 08/18/2018    OBJECTIVE:  BP 132/80   Pulse 82   Temp 98 F (36.7 C)   Ht 5\' 4"  (1.626 m)   Wt 279 lb (126.6 kg)   SpO2 100%   BMI 47.89 kg/m   General: NAD, pleasant, able to participate in exam Cardiac: RRR, no murmurs auscultated Respiratory: CTAB, normal WOB Abdomen: soft, non-tender, non-distended, normoactive bowel sounds Extremities: warm and well perfused, no edema or cyanosis Skin: warm and dry, no rashes noted Neuro: alert, no obvious focal deficits, speech normal Psych: Normal affect and mood  ASSESSMENT/PLAN:  No problem-specific Assessment & Plan notes found for this encounter.   No orders of the defined types were placed in this encounter.  No orders of the defined types were placed in this encounter.  No follow-ups  on file. Alfredo Martinez, MD 03/27/2022, 11:58 AM PGY-2, [facility] {    This will disappear when note is signed, click to select method of visit    :1}

## 2022-03-27 NOTE — Patient Instructions (Addendum)
It was great to see you today! Thank you for choosing [facility] for your primary care. Christine Parks was seen for follow up.  Today we addressed: You need an HIV, BMP, and Hep C for a one time check for healthcare maintenance    Can try B12 gummies over the counter     If you haven't already, sign up for My Chart to have easy access to your labs results, and communication with your primary care physician.  I recommend that you always bring your medications to each appointment as this makes it easy to ensure you are on the correct medications and helps [place] not miss refills when you need them. Call the clinic at [phone] if your symptoms worsen or you have any concerns.  You should return to our clinic No follow-ups on file. Please arrive 15 minutes before your appointment to ensure smooth check in process.  We appreciate your efforts in making this happen.  Thank you for allowing me to participate in your care, Alfredo Martinez, MD 03/27/2022, 8:14 AM PGY-2, [facility]

## 2022-03-28 LAB — HIV ANTIBODY (ROUTINE TESTING W REFLEX): HIV Screen 4th Generation wRfx: NONREACTIVE

## 2022-03-28 LAB — BASIC METABOLIC PANEL
BUN/Creatinine Ratio: 12 (ref 9–23)
BUN: 9 mg/dL (ref 6–24)
CO2: 22 mmol/L (ref 20–29)
Calcium: 9.8 mg/dL (ref 8.7–10.2)
Chloride: 102 mmol/L (ref 96–106)
Creatinine, Ser: 0.77 mg/dL (ref 0.57–1.00)
Glucose: 92 mg/dL (ref 70–99)
Potassium: 4.5 mmol/L (ref 3.5–5.2)
Sodium: 139 mmol/L (ref 134–144)
eGFR: 99 mL/min/{1.73_m2} (ref >59)

## 2022-03-28 LAB — HEPATITIS C ANTIBODY: Hep C Virus Ab: NONREACTIVE

## 2022-03-28 LAB — TSH RFX ON ABNORMAL TO FREE T4: TSH: 1.73 u[IU]/mL (ref 0.450–4.500)

## 2022-03-28 NOTE — Assessment & Plan Note (Signed)
Lower today! No medications, continue to monitor

## 2022-03-28 NOTE — Assessment & Plan Note (Signed)
Continue working toward goal. Attempted topamax without benefit and is unable to get GLP1 based on insurance. Continues to gradually lose weight by diet and exercise. Discussed option of bariatric surgery, follows with our nutritionist here at [facility]. Try to drink more water and stay consistent with exercise as well as diet. Return for nutrition follow up in 1 month. TSH, BMP ordered to monitor given HTN and obesity.

## 2022-04-22 ENCOUNTER — Ambulatory Visit: Payer: Medicaid Other | Admitting: Student

## 2022-05-02 ENCOUNTER — Ambulatory Visit: Payer: Medicaid Other | Admitting: Family Medicine

## 2022-05-09 DIAGNOSIS — M542 Cervicalgia: Secondary | ICD-10-CM | POA: Diagnosis not present

## 2022-05-13 ENCOUNTER — Ambulatory Visit (INDEPENDENT_AMBULATORY_CARE_PROVIDER_SITE_OTHER): Payer: Medicaid Other | Admitting: Student

## 2022-05-13 ENCOUNTER — Encounter: Payer: Self-pay | Admitting: Student

## 2022-05-13 VITALS — BP 149/97 | HR 95 | Ht 64.0 in | Wt 272.2 lb

## 2022-05-13 DIAGNOSIS — R634 Abnormal weight loss: Secondary | ICD-10-CM | POA: Diagnosis not present

## 2022-05-13 DIAGNOSIS — M549 Dorsalgia, unspecified: Secondary | ICD-10-CM

## 2022-05-13 DIAGNOSIS — I10 Essential (primary) hypertension: Secondary | ICD-10-CM

## 2022-05-13 DIAGNOSIS — B353 Tinea pedis: Secondary | ICD-10-CM | POA: Diagnosis not present

## 2022-05-13 MED ORDER — IBUPROFEN 800 MG PO TABS: 800.0000 mg | ORAL_TABLET | Freq: Three times a day (TID) | ORAL | 0 refills | Status: DC | PRN

## 2022-05-13 MED ORDER — CLOTRIMAZOLE 1 % EX CREA: 1.0000 | TOPICAL_CREAM | Freq: Two times a day (BID) | CUTANEOUS | 0 refills | Status: DC

## 2022-05-13 NOTE — Patient Instructions (Addendum)
It was great to see you today! Thank you for choosing [facility] for your primary care. Christine Parks was seen for follow up.  Today we addressed: Continuing with nutrition assistance  We will continue monitoring your BP at home  Also use the cream as instructed on your toes     Food and Nutrition Websites Type in the exact website name below each choice, or the abbreviated name identifying the site.    Dr. Assunta Gambles:  Created by pediatrician Riley Kill MD, MPH to help her patients improve their food choices, the site offers recipes and a wealth of information via videos and articles.   https://www.doctoryum.org/  [facility]: Lots of reliable information about food, nutrition, gardening, and physical activity.   https://[place].MarketLookup.fi  BestTheory.uy: The Academy of Nutrition and Dietetics provides lots of information about nutrition and other aspects of health, including for different segments of the population, i.e., kids, seniors, men, women, and LGBTQ.   PokerBag.at  Diabetes.org: The American Diabetes Association offers helpful nutrition, meal planning, and shopping tips for everyone whether you have diabetes or not.   https://diabetes.org/  Heart.org: In addition to heart-related health topics, this site offers excellent suggestions for healthy foods and behaviors for everyone, not just for those with or at risk of heart disease.   SatelliteSeeker.no  If you haven't already, sign up for My Chart to have easy access to your labs results, and communication with your primary care physician.  I recommend that you always bring your medications to each appointment as this makes it easy to ensure you are on the correct medications and helps [place] not miss refills when you need them. Call the clinic at [phone] if your symptoms worsen or you have any concerns.  You should return to our clinic  Return in about 2 months (around 07/13/2022) for HTN, pap smear . Please arrive 15 minutes before your appointment to ensure smooth check in process.  We appreciate your efforts in making this happen.  Thank you for allowing me to participate in your care, Christine Emery, MD 05/13/2022, 9:20 AM PGY-2, [facility]

## 2022-05-13 NOTE — Progress Notes (Addendum)
  SUBJECTIVE:   CHIEF COMPLAINT / HPI:   Weight Loss/Nutrition:  Patient continues to work with weight loss She notes that she is not watching what she eats but tends to eat less. Patient has also not been working out as much. She continues to lose weight though, and notes that it could possibly be related to stress. Merl has an upcoming 20-year anniversary in July with her husband and they are renewing vowels.  HTN Not on medications  Does not have a BP cuff at home   HM: In need of pap smear, flu vaccine.   Patient also reports that she has itching between her first and second toe on the left foot.  She associates this to getting her toes done recently  PERTINENT  PMH / PSH:   HTN  OBJECTIVE:  BP (!) 149/97   Pulse 95   Ht 5\' 4"  (1.626 m)   Wt 272 lb 4 oz (123.5 kg)   SpO2 99%   BMI 46.73 kg/m   General: NAD, pleasant, able to participate in exam Cardiac: RRR, no murmurs auscultated Respiratory: CTAB, normal WOB Abdomen: soft, non-tender, non-distended, normoactive bowel sounds Extremities: warm and well perfused, no edema or cyanosis Skin: warm and dry, no rashes noted Neuro: alert, no obvious focal deficits, speech normal Psych: Normal affect and mood  ASSESSMENT/PLAN:  Tinea pedis of left foot Assessment & Plan: Lotrimin cream ordered for the patient.   Orders: -     Clotrimazole; Apply 1 Application topically 2 (two) times daily.  Dispense: 30 g; Refill: 0  Weight loss Assessment & Plan: [phone]! Losing weight, congratulated on her progress.  Discussed ways to cope with stress.  Working on weight loss and nutrition.  She has a new insurance at the start of the new year, and we may attempt a GLP1 at that time.    Back pain, unspecified back location, unspecified back pain laterality, unspecified chronicity Assessment & Plan: Ibuprofen 800 given, discussed possibility of GI upset   Orders: -     Ibuprofen; Take 1 tablet (800 mg total) by mouth  every 8 (eight) hours as needed.  Dispense: 30 tablet; Refill: 0  Essential hypertension Assessment & Plan: Discussed options regarding her elevated pressure today. With shared decision making, I provided a list of manufacturers to provide a BP monitor and cuff for use at home.  She will update me with her daily blood pressures at home and we will make a decision on the medication at that time.    Return in about 2 months (around 07/13/2022) for HTN, pap smear . [name], MD 05/13/2022, 12:59 PM PGY-2, [place]

## 2022-05-13 NOTE — Assessment & Plan Note (Signed)
Discussed options regarding her elevated pressure today. With shared decision making, I provided a list of manufacturers to provide a BP monitor and cuff for use at home.  She will update me with her daily blood pressures at home and we will make a decision on the medication at that time.

## 2022-05-13 NOTE — Assessment & Plan Note (Addendum)
Ibuprofen 800 given, discussed possibility of GI upset  Ddx includes strain, OA, injury (no trauma prior to onset of pain), overuse, and very unlikely to be neurogenic claudication without any symptoms of red flags.   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

## 2022-05-13 NOTE — Assessment & Plan Note (Signed)
Lotrimin cream ordered for the patient.

## 2022-05-13 NOTE — Assessment & Plan Note (Addendum)
[phone]! Losing weight, congratulated on her progress.  Discussed ways to cope with stress.  Working on weight loss and nutrition.  She has a new insurance at the start of the new year, and we may attempt a GLP1 at that time.

## 2022-05-28 ENCOUNTER — Encounter: Payer: Self-pay | Admitting: Student

## 2022-05-28 DIAGNOSIS — N390 Urinary tract infection, site not specified: Secondary | ICD-10-CM | POA: Diagnosis not present

## 2022-05-28 DIAGNOSIS — I10 Essential (primary) hypertension: Secondary | ICD-10-CM | POA: Diagnosis not present

## 2022-05-28 MED ORDER — FLUTICASONE PROPIONATE 50 MCG/ACT NA SUSP: 1.0000 | Freq: Every day | NASAL | 0 refills | Status: DC

## 2022-05-29 ENCOUNTER — Ambulatory Visit (HOSPITAL_COMMUNITY): Payer: Medicaid Other

## 2022-06-14 ENCOUNTER — Telehealth: Payer: Self-pay | Admitting: Student

## 2022-06-14 ENCOUNTER — Ambulatory Visit (INDEPENDENT_AMBULATORY_CARE_PROVIDER_SITE_OTHER): Payer: 59 | Admitting: Student

## 2022-06-14 VITALS — BP 126/86 | HR 98 | Ht 64.0 in | Wt 278.0 lb

## 2022-06-14 DIAGNOSIS — N952 Postmenopausal atrophic vaginitis: Secondary | ICD-10-CM | POA: Diagnosis not present

## 2022-06-14 DIAGNOSIS — Z1389 Encounter for screening for other disorder: Secondary | ICD-10-CM

## 2022-06-14 DIAGNOSIS — R109 Unspecified abdominal pain: Secondary | ICD-10-CM | POA: Diagnosis not present

## 2022-06-14 DIAGNOSIS — A599 Trichomoniasis, unspecified: Secondary | ICD-10-CM | POA: Diagnosis not present

## 2022-06-14 LAB — POCT UA - MICROSCOPIC ONLY
RBC, Urine, Miroscopic: NONE SEEN (ref 0–2)
Trichomonas, UA: POSITIVE

## 2022-06-14 LAB — POCT URINALYSIS DIP (MANUAL ENTRY)
Bilirubin, UA: NEGATIVE
Blood, UA: NEGATIVE
Glucose, UA: NEGATIVE mg/dL
Ketones, POC UA: NEGATIVE mg/dL
Nitrite, UA: NEGATIVE
Protein Ur, POC: NEGATIVE mg/dL
Spec Grav, UA: 1.02 (ref 1.010–1.025)
Urobilinogen, UA: 0.2 E.U./dL
pH, UA: 6.5 (ref 5.0–8.0)

## 2022-06-14 MED ORDER — ESTROGENS CONJUGATED 0.625 MG/GM VA CREA: 1.0000 | TOPICAL_CREAM | Freq: Every day | VAGINAL | 12 refills | Status: DC

## 2022-06-14 MED ORDER — METRONIDAZOLE 500 MG PO TABS: 500.0000 mg | ORAL_TABLET | Freq: Two times a day (BID) | ORAL | 0 refills | Status: AC

## 2022-06-14 NOTE — Patient Instructions (Addendum)
It was great to see you! Thank you for allowing me to participate in your care!   Our plans for today:  - We will run your urine and see if this show any RBC - I have prescribed premarin cream for your vaginal dryness - Follow up with PCP in 2-3 weeks   Take care and seek immediate care sooner if you develop any concerns.  Gerrit Heck, MD

## 2022-06-14 NOTE — Progress Notes (Signed)
    SUBJECTIVE:   CHIEF COMPLAINT / HPI: Abdominal cramping and pink tinge after wiping  TAH in 2017 due to fibroids. Has been treated by OBGYN with vagifem and osphena for vaginal dryness in the past. 3 weeks ago when she wiped after urinating had some pink fluid.  Went to the urgent care and found little blood clot in urine at that time at [facility].  She did not have dysuria/frequency at that time.  Was given ciprofloxacin course and took 3 days then got a call that the culture was negative and stopped antibiotic. Seemed to be helping with symptoms while she was on it.   Recently had tooth infection and took abx as well. Had less water intake during that time. Has had very small amount of pinkish tinge when wiping after urinating. Yesterday it happened but has had none today. Drinking a lot of water.   Cramping on left side "feels like period" and tolerable. No dysuria, no frequency or urge to go. No fevers, diarrhea or constipation. Does have some bloating like she feels before a period.  She denies any vaginal bleeding.  PERTINENT  PMH / PSH: TAH 2017  OBJECTIVE:   BP 126/86   Pulse 98   Ht 5\' 4"  (1.626 m)   Wt 278 lb (126.1 kg)   SpO2 98%   BMI 47.72 kg/m   General: Well appearing, NAD, awake, alert, responsive to questions Head: Normocephalic atraumatic CV: Regular rate and rhythm no murmurs rubs or gallops Respiratory: Clear to ausculation bilaterally, no increased work of breathing Abdomen: Soft, non-tender throughout to deep palpation, non-distended, normoactive bowel sounds, no CVA tenderness Pelvic: VULVA: normal appearing vulva with no masses, tenderness or lesions, VAGINA: Normal appearing vagina with normal color, no lesions, with scant discharge present, NO adnexal tenderness or masses palpated on bimanual exam Chaperone Lenise Herald CMA present for pelvic exam   ASSESSMENT/PLAN:   Abdominal cramping Patient has been having abdominal cramping for the past 3 days that  feels like it.  However does have TAH.  Does not have any acute vaginal bleeding or masses/lesions seen on examination.  She is not having any urinary complaints right now but will check urine to screen for hematuria.  Dry vaginal mucosa could be causing some of her issues as well. UA found trichomonas on sample. -UA + microscopy to check for RBC and urine > trich found - metronidazole course prescribed + partner tx (discussed on phone with patient-will need other STI testing as well) -Prescribed Premarin cream vaginally -Follow-up with PCP on 11/29 -Discussed ED/return precautions with patient    Gerrit Heck, MD [place]

## 2022-06-14 NOTE — Assessment & Plan Note (Addendum)
Patient has been having abdominal cramping for the past 3 days that feels like it.  However does have TAH.  Does not have any acute vaginal bleeding or masses/lesions seen on examination.  She is not having any urinary complaints right now but will check urine to screen for hematuria.  Dry vaginal mucosa could be causing some of her issues as well. UA found trichomonas on sample. -UA + microscopy to check for RBC and urine > trich found - metronidazole course prescribed + partner tx (discussed on phone with patient-will need other STI testing as well) -Prescribed Premarin cream vaginally -Follow-up with PCP on 11/29 -Discussed ED/return precautions with patient

## 2022-06-14 NOTE — Telephone Encounter (Signed)
Called patient and confirmed DOB. Discussed her UA showed trichomonas and I will send in metronidazole course for her BID for 7 days. I discussed her partner needs to get treated as well. She confirmed Christine Parks as patient and his DOB. I called [facility] on [address] and placed 2 g single dose Metronidazole prescription for him there. Patient will likely need additional STI testing. Follow up with PCP on 11/29.

## 2022-06-28 ENCOUNTER — Other Ambulatory Visit: Payer: Self-pay | Admitting: Student

## 2022-07-10 ENCOUNTER — Encounter: Payer: Self-pay | Admitting: Student

## 2022-07-10 ENCOUNTER — Ambulatory Visit (INDEPENDENT_AMBULATORY_CARE_PROVIDER_SITE_OTHER): Payer: 59 | Admitting: Student

## 2022-07-10 VITALS — BP 157/99 | HR 86 | Wt 276.5 lb

## 2022-07-10 DIAGNOSIS — M542 Cervicalgia: Secondary | ICD-10-CM | POA: Diagnosis not present

## 2022-07-10 DIAGNOSIS — R7303 Prediabetes: Secondary | ICD-10-CM | POA: Diagnosis not present

## 2022-07-10 DIAGNOSIS — E8881 Metabolic syndrome: Secondary | ICD-10-CM

## 2022-07-10 DIAGNOSIS — I10 Essential (primary) hypertension: Secondary | ICD-10-CM | POA: Diagnosis not present

## 2022-07-10 MED ORDER — CYCLOBENZAPRINE HCL 10 MG PO TABS: 5.0000 mg | ORAL_TABLET | Freq: Three times a day (TID) | ORAL | 0 refills | Status: AC | PRN

## 2022-07-10 MED ORDER — HYDROCHLOROTHIAZIDE 25 MG PO TABS: 25.0000 mg | ORAL_TABLET | Freq: Every day | ORAL | 3 refills | Status: DC

## 2022-07-10 MED ORDER — LIRAGLUTIDE 18 MG/3ML ~~LOC~~ SOPN: 0.6000 mg | PEN_INJECTOR | Freq: Every day | SUBCUTANEOUS | 1 refills | Status: DC

## 2022-07-10 NOTE — Assessment & Plan Note (Signed)
Neurovascularly intact, maintaining strength in the left shoulder.  No red flags.  Will continue with shoulder exercises provided to the patient for what appears to be MSK in origin and will provide Flexeril short dose for breakthrough pain, can continue ibuprofen as she has been.

## 2022-07-10 NOTE — Assessment & Plan Note (Signed)
Patient previously was able to come off of antihypertensives and maintain an appropriate blood pressure.  Given multiple instances of elevated blood pressure in the past, will continue with HCTZ at lowest dose.  Patient has up-to-date BMP.  Will follow-up with her in a month or 2 to evaluate her blood pressure.  Instructed the patient to continue checking her blood pressure, has a cuff at home.

## 2022-07-10 NOTE — Assessment & Plan Note (Signed)
Patient is very dedicated to continue with weight loss and I believe would benefit greatly from a GLP-1.  She meets criteria for metabolic syndrome, she has some insulin resistance, suffering from hypertension.  She has no red flag symptoms that would cause contraindication to this medication.  We will try to get her Victoza as it may be covered by insurance.  Will try to route this to pharmacy team for assistance if she is unable to have this covered.  If we cannot get this covered through her new insurance, we can consider gastric bypass surgery as she has shown that she is lost a significant amount of weight previously.

## 2022-07-10 NOTE — Patient Instructions (Addendum)
It was great to see you today! Thank you for choosing [facility] for your primary care. Christine Parks was seen for follow up.  I will try to rx Victoza and have pharmacy team get involved for assistance We may need to start a blood pressure medication for your pressure, HCTZ 25 mg, please  Flexeril to pharmacy Shoulder exercises every morning   If you haven't already, sign up for My Chart to have easy access to your labs results, and communication with your primary care physician.  I recommend that you always bring your medications to each appointment as this makes it easy to ensure you are on the correct medications and helps [place] not miss refills when you need them. Call the clinic at [phone] if your symptoms worsen or you have any concerns.  You should return to our clinic Return in about 4 weeks (around 08/07/2022). Please arrive 15 minutes before your appointment to ensure smooth check in process.  We appreciate your efforts in making this happen.  Thank you for allowing me to participate in your care, Alfredo Martinez, MD 07/10/2022, 10:22 AM PGY-2, [facility]

## 2022-07-10 NOTE — Progress Notes (Signed)
SUBJECTIVE:   CHIEF COMPLAINT / HPI:   Discuss Weight Loss:  She is having difficulty with weight loss throughout the holiday season.  The patient has tried to a GLP-1 with no success through her insurance.  She has lost a significant amount of weight prior to coming to me through healthy weight and wellness.  She has a lot of stress at work she has taken on many more roles in her job.  She notes that this is likely contributing to her inability to continue with weight loss.  Patient does have left-sided neck to shoulder pain that has been present for several days.  The patient reports that this is due to sleeping incorrectly.  She does take ibuprofen for the pain.  Patient also presents with an elevated blood pressure in clinic.  She does not take any medications but was taking verapamil in the past.  PERTINENT  PMH / PSH: elevated blood pressures    OBJECTIVE:  BP (!) 157/99   Pulse 86   Wt 276 lb 8 oz (125.4 kg)   SpO2 99%   BMI 47.46 kg/m  Physical Exam  General: Alert and oriented in no apparent distress Heart: Regular rate and rhythm with no murmurs appreciated Lungs: CTA bilaterally, no wheezing Abdomen: Bowel sounds present, no abdominal pain Skin: Warm and dry Extremities: No lower extremity edema, tenderness over left cervical paraspinal muscles into the trapezius.  Good range of motion of the neck and shoulder.  Minimal tenderness to palpation.  No tenderness over bony spinous processes.  Good strength of the upper extremities 5/5.   ASSESSMENT/PLAN:  Neck pain on left side Assessment & Plan: Neurovascularly intact, maintaining strength in the left shoulder.  No red flags.  Will continue with shoulder exercises provided to the patient for what appears to be MSK in origin and will provide Flexeril short dose for breakthrough pain, can continue ibuprofen as she has been.  Orders: -     Cyclobenzaprine HCl; Take 0.5 tablets (5 mg total) by mouth 3 (three) times daily as  needed for up to 7 days for muscle spasms.  Dispense: 10 tablet; Refill: 0  Morbid obesity ([place]) Assessment & Plan: Patient is very dedicated to continue with weight loss and I believe would benefit greatly from a GLP-1.  She meets criteria for metabolic syndrome, she has some insulin resistance, suffering from hypertension.  She has no red flag symptoms that would cause contraindication to this medication.  We will try to get her Victoza as it may be covered by insurance.  Will try to route this to pharmacy team for assistance if she is unable to have this covered.  If we cannot get this covered through her new insurance, we can consider gastric bypass surgery as she has shown that she is lost a significant amount of weight previously.   Prediabetes -     Liraglutide; Inject 0.6 mg into the skin daily. 0.6 mg once daily for 1 week,then increase to 1.2 mg once daily,max 1.8  mg  Dispense: 3 mL; Refill: 1  Essential hypertension Assessment & Plan: Patient previously was able to come off of antihypertensives and maintain an appropriate blood pressure.  Given multiple instances of elevated blood pressure in the past, will continue with HCTZ at lowest dose.  Patient has up-to-date BMP.  Will follow-up with her in a month or 2 to evaluate her blood pressure.  Instructed the patient to continue checking her blood pressure, has a cuff at home.  Orders: -     hydroCHLOROthiazide; Take 1 tablet (25 mg total) by mouth daily.  Dispense: 90 tablet; Refill: 3  Metabolic syndrome -     Liraglutide; Inject 0.6 mg into the skin daily. 0.6 mg once daily for 1 week,then increase to 1.2 mg once daily,max 1.8  mg  Dispense: 3 mL; Refill: 1   Return in about 4 weeks (around 08/07/2022). Alfredo Martinez, MD 07/10/2022, 5:41 PM PGY-2, [facility]

## 2022-07-11 ENCOUNTER — Encounter: Payer: Self-pay | Admitting: Student

## 2022-07-12 ENCOUNTER — Other Ambulatory Visit (HOSPITAL_COMMUNITY): Payer: Self-pay

## 2022-07-12 ENCOUNTER — Telehealth: Payer: Self-pay

## 2022-07-12 NOTE — Telephone Encounter (Signed)
A Prior Authorization was initiated for this patients VICTOZA through CoverMyMeds.   Key: [id number]

## 2022-07-14 ENCOUNTER — Encounter: Payer: Self-pay | Admitting: Student

## 2022-07-15 NOTE — Telephone Encounter (Signed)
Received phone call from insurance PA line regarding medication.   She states that medication is still showing that PA has not been completed.   Attempted to check status via CoverMyMeds. Error message received stating that there was invalid data in field.   Will forward to [place] for further assistance.   Veronda Prude, RN

## 2022-07-16 NOTE — Telephone Encounter (Signed)
Prior Auth for patients medication VICTOZA denied by [facility] [employer] via CoverMyMeds.   Reason: Per your health plan's criteria, this drug is covered if you meet the following:   One of the following:  (1) You have a disease with high blood sugars (diabetes) and one of the following: (A) A type of heart disease (atherosclerotic cardiovascular disease or heart failure). (B) A type of kidney disease (chronic kidney disease).  (2) You have tried or cannot use one metformin containing drug.  CoverMyMeds Key: [id number]

## 2022-07-17 ENCOUNTER — Encounter: Payer: Self-pay | Admitting: Student

## 2022-07-17 DIAGNOSIS — R7303 Prediabetes: Secondary | ICD-10-CM

## 2022-07-17 DIAGNOSIS — E8881 Metabolic syndrome: Secondary | ICD-10-CM

## 2022-07-17 DIAGNOSIS — Z6841 Body Mass Index (BMI) 40.0 and over, adult: Secondary | ICD-10-CM

## 2022-07-17 DIAGNOSIS — E88819 Insulin resistance, unspecified: Secondary | ICD-10-CM

## 2022-07-18 MED ORDER — METFORMIN HCL ER 500 MG PO TB24: 500.0000 mg | ORAL_TABLET | Freq: Two times a day (BID) | ORAL | 1 refills | Status: DC

## 2022-07-24 MED ORDER — LIRAGLUTIDE 18 MG/3ML ~~LOC~~ SOPN: 0.6000 mg | PEN_INJECTOR | Freq: Every day | SUBCUTANEOUS | 1 refills | Status: DC

## 2022-07-24 NOTE — Addendum Note (Signed)
Addended by: Alfredo Martinez on: 07/24/2022 09:13 PM   Modules accepted: Orders

## 2022-08-02 ENCOUNTER — Encounter: Payer: Self-pay | Admitting: Emergency Medicine

## 2022-08-02 ENCOUNTER — Emergency Department
Admission: EM | Admit: 2022-08-02 | Discharge: 2022-08-02 | Disposition: A | Discharge status: Home / Self Care | Payer: 59 | Attending: Emergency Medicine | Admitting: Emergency Medicine

## 2022-08-02 ENCOUNTER — Other Ambulatory Visit: Payer: Self-pay

## 2022-08-02 DIAGNOSIS — R42 Dizziness and giddiness: Secondary | ICD-10-CM | POA: Insufficient documentation

## 2022-08-02 DIAGNOSIS — I10 Essential (primary) hypertension: Secondary | ICD-10-CM | POA: Diagnosis not present

## 2022-08-02 DIAGNOSIS — H748X2 Other specified disorders of left middle ear and mastoid: Secondary | ICD-10-CM | POA: Insufficient documentation

## 2022-08-02 DIAGNOSIS — Z1152 Encounter for screening for COVID-19: Secondary | ICD-10-CM | POA: Insufficient documentation

## 2022-08-02 DIAGNOSIS — R000 Tachycardia, unspecified: Secondary | ICD-10-CM | POA: Insufficient documentation

## 2022-08-02 DIAGNOSIS — H6592 Unspecified nonsuppurative otitis media, left ear: Secondary | ICD-10-CM

## 2022-08-02 LAB — CBC
HCT: 42.7 % (ref 36.0–46.0)
Hemoglobin: 13.7 g/dL (ref 12.0–15.0)
MCH: 28.6 pg (ref 26.0–34.0)
MCHC: 32.1 g/dL (ref 30.0–36.0)
MCV: 89.1 fL (ref 80.0–100.0)
Platelets: 286 10*3/uL (ref 150–400)
RBC: 4.79 MIL/uL (ref 3.87–5.11)
RDW: 13.1 % (ref 11.5–15.5)
WBC: 7.3 10*3/uL (ref 4.0–10.5)
nRBC: 0 % (ref 0.0–0.2)

## 2022-08-02 LAB — TROPONIN I (HIGH SENSITIVITY): Troponin I (High Sensitivity): <2 ng/L (ref <18)

## 2022-08-02 LAB — URINALYSIS, ROUTINE W REFLEX MICROSCOPIC
Bilirubin Urine: NEGATIVE
Glucose, UA: NEGATIVE mg/dL
Hgb urine dipstick: NEGATIVE
Ketones, ur: NEGATIVE mg/dL
Nitrite: NEGATIVE
Protein, ur: NEGATIVE mg/dL
Specific Gravity, Urine: 1.017 (ref 1.005–1.030)
pH: 5 (ref 5.0–8.0)

## 2022-08-02 LAB — POC URINE PREG, ED: Preg Test, Ur: NEGATIVE

## 2022-08-02 LAB — BASIC METABOLIC PANEL
Anion gap: 8 (ref 5–15)
BUN: 9 mg/dL (ref 6–20)
CO2: 24 mmol/L (ref 22–32)
Calcium: 9 mg/dL (ref 8.9–10.3)
Chloride: 106 mmol/L (ref 98–111)
Creatinine, Ser: 0.68 mg/dL (ref 0.44–1.00)
GFR, Estimated: >60 mL/min (ref >60)
Glucose, Bld: 110 mg/dL — ABNORMAL HIGH (ref 70–99)
Potassium: 3.5 mmol/L (ref 3.5–5.1)
Sodium: 138 mmol/L (ref 135–145)

## 2022-08-02 LAB — RESP PANEL BY RT-PCR (RSV, FLU A&B, COVID)  RVPGX2
Influenza A by PCR: NEGATIVE
Influenza B by PCR: NEGATIVE
Resp Syncytial Virus by PCR: NEGATIVE
SARS Coronavirus 2 by RT PCR: NEGATIVE

## 2022-08-02 MED ORDER — MECLIZINE HCL 25 MG PO TABS
25.0000 mg | ORAL_TABLET | Freq: Once | ORAL | Status: AC
  Administered 2022-08-02: 25 mg via ORAL
  Filled 2022-08-02: qty 1

## 2022-08-02 MED ORDER — MECLIZINE HCL 25 MG PO TABS: 25.0000 mg | ORAL_TABLET | Freq: Three times a day (TID) | ORAL | 0 refills | Status: DC | PRN

## 2022-08-02 NOTE — Discharge Instructions (Addendum)
Continue to use the meclizine as needed for any further episodes of vertigo, you can use this medicine up to 3 times per day.  Check your MyChart results for the flu/COVID/RSV swab test result.  Follow-up with the ENT doctor in the clinic, but return to the ED with any worsening symptoms despite the slight vertigo that will not go away, passing out with vertigo, strokelike symptoms

## 2022-08-02 NOTE — ED Triage Notes (Signed)
Patient ambulatory to triage with steady gait, without difficulty or distress noted; pt reports dizziness since last night, denies any accomp symptoms, denies any recent illness; st hx vertigo

## 2022-08-02 NOTE — ED Provider Notes (Signed)
[facility] Provider Note    Event Date/Time   First MD Initiated Contact with Patient 08/02/22 0522     (approximate)   History   Dizziness   HPI  Christine Parks is a 41 y.o. female who presents to the ED for evaluation of Dizziness   I reviewed a PCP visit from 11/29.  History of HTN, obesity, metabolic syndrome.  Patient presents to the ED for evaluation of intermittent and positional vertiginous dizziness.  Reports symptoms over the past 1 day or so.  Symptoms worse with movement and turning a certain way, and do not persist.  Reports a fullness sensation to her left ear and perhaps a mild scratchy throat, but no fevers or respiratory congestion or cough.  Ambulating at baseline without vision changes   Physical Exam   Triage Vital Signs: ED Triage Vitals  Enc Vitals Group     BP 08/02/22 0445 (!) 162/115     Pulse Rate 08/02/22 0445 (!) 104     Resp 08/02/22 0445 20     Temp 08/02/22 0445 98.2 F (36.8 C)     Temp Source 08/02/22 0445 Oral     SpO2 08/02/22 0445 100 %     Weight 08/02/22 0443 171 lb (77.6 kg)     Height 08/02/22 0443 5\' 4"  (1.626 m)     Head Circumference --      Peak Flow --      Pain Score 08/02/22 0439 0     Pain Loc --      Pain Edu? --      Excl. in GC? --     Most recent vital signs: Vitals:   08/02/22 0445  BP: (!) 162/115  Pulse: (!) 104  Resp: 20  Temp: 98.2 F (36.8 C)  SpO2: 100%    General: Awake, no distress.  CV:  Good peripheral perfusion.  Resp:  Normal effort.  Abd:  No distention.  Soft and benign throughout MSK:  No deformity noted.  Neuro:  No focal deficits appreciated. Cranial nerves II through XII intact 5/5 strength and sensation in all 4 extremities Other:  Dull effusion behind the left TM without signs of erythema, perforation or for signs of infection.   ED Results / Procedures / Treatments   Labs (all labs ordered are listed, but only abnormal results are  displayed) Labs Reviewed  URINALYSIS, ROUTINE W REFLEX MICROSCOPIC - Abnormal; Notable for the following components:      Result Value   Color, Urine YELLOW (*)    APPearance HAZY (*)    Leukocytes,Ua TRACE (*)    Bacteria, UA RARE (*)    All other components within normal limits  BASIC METABOLIC PANEL - Abnormal; Notable for the following components:   Glucose, Bld 110 (*)    All other components within normal limits  RESP PANEL BY RT-PCR (RSV, FLU A&B, COVID)  RVPGX2  CBC  POC URINE PREG, ED  TROPONIN I (HIGH SENSITIVITY)  TROPONIN I (HIGH SENSITIVITY)    EKG Sinus tachycardia with a rate of 102 bpm.  Normal axis and intervals.  Nonspecific ST changes with T wave inversions laterally and inferiorly without STEMI.  RADIOLOGY   Official radiology report(s): No results found.  PROCEDURES and INTERVENTIONS:  Procedures  Medications  meclizine (ANTIVERT) tablet 25 mg (25 mg Oral Given 08/02/22 08/04/22)     IMPRESSION / MDM / ASSESSMENT AND PLAN / ED COURSE  I reviewed the triage vital signs  and the nursing notes.  Differential diagnosis includes, but is not limited to, stroke, BPPV, ear effusion,  {Patient presents with symptoms of an acute illness or injury that is potentially life-threatening.  41 year old female presents to the ED with intermittent vertiginous dizziness, likely of peripheral etiology and suitable for outpatient management.  Has a reassuring examination without signs of neurologic or vascular deficits.  No signs of trauma.  Blood work is reassuring with a normal CBC, metabolic panel, troponin.  UA with trace leukocytes, but she has no urinary symptoms and I doubt cystitis contributing to this.  Improved symptoms with meclizine and she was noted to have an effusion within the left middle ear as a possible contributor.  No clear signs of infectious AOM to require antibiotics.  Will provide a prescription for meclizine and refer her to ENT.  Clinical Course as  of 08/02/22 0641  Fri Aug 02, 2022  0636 Reassessed.  Sitting up on the end of the bed and looks well.  Reports feeling little bit better.  We discussed pending viral swab and she does not really want a wait for this, she thinks reasonable.  We discussed ENT referral, meclizine at home and return precautions. [DS]    Clinical Course User Index [DS] Delton Prairie, MD     FINAL CLINICAL IMPRESSION(S) / ED DIAGNOSES   Final diagnoses:  Vertigo  Middle ear effusion, left     Rx / DC Orders   ED Discharge Orders          Ordered    meclizine (ANTIVERT) 25 MG tablet  3 times daily PRN        08/02/22 3532             Note:  This document was prepared using Dragon voice recognition software and may include unintentional dictation errors.   Delton Prairie, MD 08/02/22 [phone]

## 2022-09-24 ENCOUNTER — Other Ambulatory Visit: Payer: Self-pay | Admitting: Student

## 2022-09-24 DIAGNOSIS — M549 Dorsalgia, unspecified: Secondary | ICD-10-CM

## 2022-09-24 MED ORDER — IBUPROFEN 800 MG PO TABS: 800.0000 mg | ORAL_TABLET | Freq: Three times a day (TID) | ORAL | 0 refills | Status: DC | PRN

## 2022-09-30 ENCOUNTER — Ambulatory Visit: Payer: 59 | Admitting: Student

## 2022-09-30 NOTE — Progress Notes (Deleted)
  SUBJECTIVE:   CHIEF COMPLAINT / HPI:   Hypertension:   today. Home medications include: HCTZ 25 mg. She {Blank single:19197::"endorses","does not endorse"} taking these medications as prescribed. {Blank single:19197::"Does","Does not"} check blood pressure at home.*** Diet ***. Exercise ***. Most recent creatinine trend:  Lab Results  Component Value Date   CREATININE 0.68 08/02/2022   CREATININE 0.77 03/27/2022   CREATININE 0.75 01/06/2018   Patient {HAS HAS [id number] had a BMP in the past 1 year.    PERTINENT  PMH / PSH:   Elevated Bps Prediabetes  Vit D def Obesity   OBJECTIVE:  There were no vitals taken for this visit. Physical Exam  General: Alert and oriented in no apparent distress Heart: Regular rate and rhythm with no murmurs appreciated Lungs: CTA bilaterally, no wheezing Abdomen: Bowel sounds present, no abdominal pain Skin: Warm and dry Extremities: No lower extremity edema   ASSESSMENT/PLAN:  There are no diagnoses linked to this encounter. No follow-ups on file. [name], MD 09/30/2022, 7:54 AM PGY-2, [place] {    This will disappear when note is signed, click to select method of visit    :1}

## 2022-10-07 ENCOUNTER — Ambulatory Visit: Payer: 59 | Admitting: Student

## 2022-10-07 NOTE — Progress Notes (Deleted)
  SUBJECTIVE:   CHIEF COMPLAINT / HPI:   Medication Follow up  PERTINENT  PMH / PSH:   Elevated blood pressures Obesity   OBJECTIVE:  There were no vitals taken for this visit. Physical Exam   ASSESSMENT/PLAN:  There are no diagnoses linked to this encounter. No follow-ups on file. [name], MD 10/07/2022, 12:06 PM PGY-***, [facility] {    This will disappear when note is signed, click to select method of visit    :1}

## 2022-11-04 ENCOUNTER — Other Ambulatory Visit: Payer: Self-pay | Admitting: Student

## 2022-11-04 DIAGNOSIS — M549 Dorsalgia, unspecified: Secondary | ICD-10-CM

## 2022-11-05 MED ORDER — IBUPROFEN 800 MG PO TABS: 800.0000 mg | ORAL_TABLET | Freq: Three times a day (TID) | ORAL | 0 refills | Status: DC | PRN

## 2022-11-06 ENCOUNTER — Other Ambulatory Visit: Payer: Self-pay | Admitting: Student

## 2022-11-06 DIAGNOSIS — B353 Tinea pedis: Secondary | ICD-10-CM

## 2022-11-08 MED ORDER — CLOTRIMAZOLE 1 % EX CREA: 1.0000 | TOPICAL_CREAM | Freq: Two times a day (BID) | CUTANEOUS | 0 refills | Status: DC

## 2022-11-29 ENCOUNTER — Other Ambulatory Visit: Payer: Self-pay | Admitting: Student

## 2023-01-03 ENCOUNTER — Other Ambulatory Visit: Payer: Self-pay | Admitting: Student

## 2023-01-03 DIAGNOSIS — M549 Dorsalgia, unspecified: Secondary | ICD-10-CM

## 2023-01-04 MED ORDER — IBUPROFEN 800 MG PO TABS: 800.0000 mg | ORAL_TABLET | Freq: Three times a day (TID) | ORAL | 0 refills | Status: DC | PRN

## 2023-01-10 ENCOUNTER — Ambulatory Visit (HOSPITAL_COMMUNITY)
Admission: EM | Admit: 2023-01-10 | Discharge: 2023-01-10 | Disposition: A | Discharge status: Home / Self Care | Payer: Medicaid Other | Attending: Emergency Medicine | Admitting: Emergency Medicine

## 2023-01-10 ENCOUNTER — Encounter (HOSPITAL_COMMUNITY): Payer: Self-pay | Admitting: *Deleted

## 2023-01-10 DIAGNOSIS — I10 Essential (primary) hypertension: Secondary | ICD-10-CM

## 2023-01-10 DIAGNOSIS — H60391 Other infective otitis externa, right ear: Secondary | ICD-10-CM | POA: Diagnosis not present

## 2023-01-10 MED ORDER — OFLOXACIN 0.3 % OT SOLN: 5.0000 [drp] | Freq: Every day | OTIC | 0 refills | Status: AC

## 2023-01-10 NOTE — ED Provider Notes (Signed)
[facility]    CSN: [id number] Arrival date & time: 01/10/23  4098      History   Chief Complaint Chief Complaint  Patient presents with   Otalgia    HPI Christine Parks is a 42 y.o. female.   Patient presents to clinic for right ear pain that has been ongoing since Sunday.  She also reports scalp pain and left-sided throat tenderness.  She has been taking ibuprofen and Tylenol, both of which are not working.  She is unsure if she is having ear drainage, notices that her canal was wet and had excess moisture.  Denies any fevers.    The history is provided by the patient and medical records.  Otalgia   Past Medical History:  Diagnosis Date   Abnormal EKG 01/15/2018   Bilateral leg edema 10/31/2016   Last Assessment & Plan:  Patient does have bilateral lower extremity edema she says that is coming from the verapamil 120 3 times a day since the dose is higher than her regular dose of 240 could be contributing for it we'll try to change her to Verapamil SR 240 mg a day and see how she does if she continues to have lower extremity edema we'll check echocardiogram.   Chest pain 06/28/2013   Last Assessment & Plan:  Patient reports intermittent chest pain accompanied by dyspnea and anxiety.  Her pain has pleuritic component to it.  However patient is unable to exercise the past several month due to complaints of exertional dyspnea.  She tells that she feels unsafe.  Therefore will investigate with exercise echocardiogram.   Dizziness 10/31/2016   Last Assessment & Plan:  As above.  We'll try to change her to Verapamil SR and see how she does if she continues to have problems then we will consider changing it.   Essential hypertension 10/31/2016   Last Assessment & Plan:  Patient with known hypertension did very well with the meropenem will SR 240 mg a day.  Her primary was giving her verapamil 120 mg 3 times a day and which is not helping her blood pressure on top of that she  is having a lot of side effects with the edema and dyspnea on exertion and palpitations.  She wants to get back on her regular where panel to 40 mg SR so we'll go ahe   Exposure to STD 08/18/2018   Hypertension    Palpitations 10/31/2016   Last Assessment & Plan:  Patient says that palpitations are also happening since she is on short-acting meropenem L plan as about we'll change it and really reevaluate how the palpitations are.   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   Menopause 03/27/2022   Vitamin D deficiency 03/27/2022   Weight loss 01/25/2022   Morbid obesity (HCC) 11/17/2020   Abnormal EKG 01/15/2018   Essential hypertension 10/31/2016   Palpitations 10/31/2016    Past Surgical History:  Procedure Laterality Date   ABDOMINAL HYSTERECTOMY     CESAREAN SECTION  11/26/2001   CESAREAN SECTION  06/17/2004   LAPAROSCOPIC HYSTERECTOMY     TONSILLECTOMY  OB History     Gravida  4   Para      Term      Preterm      AB      Living  4      SAB      IAB      Ectopic      Multiple      Live Births  4            Home Medications    Prior to Admission medications   Medication Sig Start Date End Date Taking? Authorizing Provider  ofloxacin (FLOXIN) 0.3 % OTIC solution Place 5 drops into the right ear daily for 7 days. 01/10/23 01/17/23 Yes Rinaldo Ratel, [place] N, FNP  fluticasone [facility]) 50 MCG/ACT nasal spray INSTILL 1 SPRAY INTO BOTH NOSTRILS DAILY 11/29/22   Levin Erp, MD  hydrochlorothiazide (HYDRODIURIL) 25 MG tablet Take 1 tablet (25 mg total) by mouth daily. 07/10/22   Alfredo Martinez, MD  lidocaine  (LIDODERM) 5 % Place 1 patch onto the skin daily. Remove & Discard patch within 12 hours or as directed by MD 01/25/22   Alfredo Martinez, MD  meclizine (ANTIVERT) 25 MG tablet Take 1 tablet (25 mg total) by mouth 3 (three) times daily as needed for dizziness. 08/02/22   Delton Prairie, MD    Family History Family History  Problem Relation Age of Onset   Diabetes Mother    Hypertension Mother    Diabetes Father    Hypertension Father     Social History Social History   Tobacco Use   Smoking status: Former    Types: Cigarettes    Quit date: 10/25/2014    Years since quitting: 8.2   Smokeless tobacco: Never  Vaping Use   Vaping Use: Never used  Substance Use Topics   Alcohol use: Yes    Comment: occasional glass of wine   Drug use: No     Allergies   Penicillins   Review of Systems Review of Systems  HENT:  Positive for ear pain.      Physical Exam Triage Vital Signs ED Triage Vitals  Enc Vitals Group     BP 01/10/23 0928 (!) 178/115     Pulse Rate 01/10/23 0928 89     Resp 01/10/23 0928 18     Temp 01/10/23 0928 98 F (36.7 C)     Temp Source 01/10/23 0928 Oral     SpO2 01/10/23 0928 99 %     Weight --      Height --      Head Circumference --      Peak Flow --      Pain Score 01/10/23 0923 8     Pain Loc --      Pain Edu? --      Excl. in GC? --    No data found.  Updated Vital Signs BP (!) 178/115 (BP Location: Right Arm)   Pulse 89   Temp 98 F (36.7 C) (Oral)   Resp 18   SpO2 99%   Visual Acuity Right Eye Distance:   Left Eye Distance:   Bilateral Distance:    Right Eye Near:   Left Eye Near:    Bilateral Near:     Physical Exam Vitals and nursing note reviewed.  Constitutional:      Appearance: Normal appearance.  HENT:     Head: Normocephalic and atraumatic.     Right Ear: Swelling and tenderness present.  Left Ear: Tympanic membrane, ear canal and external ear normal.     Ears:     Comments: Right ear pain elicited with pinna  and tragus manipulation, external auditory canal with swelling and tenderness.    Nose: Nose normal.     Mouth/Throat:     Mouth: Mucous membranes are moist.  Eyes:     Conjunctiva/sclera: Conjunctivae normal.  Cardiovascular:     Rate and Rhythm: Normal rate.  Pulmonary:     Effort: Pulmonary effort is normal. No respiratory distress.  Neurological:     General: No focal deficit present.     Mental Status: She is alert.  Psychiatric:        Mood and Affect: Mood normal.        Behavior: Behavior normal. Behavior is cooperative.      UC Treatments / Results  Labs (all labs ordered are listed, but only abnormal results are displayed) Labs Reviewed - No data to display  EKG   Radiology No results found.  Procedures Procedures (including critical care time)  Medications Ordered in UC Medications - No data to display  Initial Impression / Assessment and Plan / UC Course  I have reviewed the triage vital signs and the nursing notes.  Pertinent labs & imaging results that were available during my care of the patient were reviewed by me and considered in my medical decision making (see chart for details).  Vitals and triage reviewed, patient is hemodynamically stable.  Hypertensive with history of same, reports noncompliance with antihypertensive medications.  Encouraged to take medication as prescribed and to follow-up with PCP.  Without signs of hypertensive urgency or emergency such as vision changes or worst headache of her life.  Right external auditory canal with swelling and tenderness, tragus and pinna manipulation causing pain.  Will cover with ofloxacin for otitis externa.  Suspect headache may be related to hypertension, encouraged to follow-up with PCP for further evaluation.  Strict emergency and return precautions given, no questions at this time.     Final Clinical Impressions(s) / UC Diagnoses   Final diagnoses:  Other infective acute otitis externa of  right ear  Essential hypertension     Discharge Instructions      Please take the antibiotic eardrops as prescribed and until finished.  Do not get your external auditory canal wet, you can put a cotton swab anterior canal to help prevent this.  Your blood pressure was also high today, I recommend taking your blood pressure medication as prescribed and following up with your primary care provider for further evaluation.  Please seek immediate care if you develop a thunderclap headache, vision changes, or any new concerning symptoms.     ED Prescriptions     Medication Sig Dispense Auth. Provider   ofloxacin (FLOXIN) 0.3 % OTIC solution Place 5 drops into the right ear daily for 7 days. 5 mL Talana Slatten, [place] N, FNP      PDMP not reviewed this encounter.   Morena Mckissack, [place] N, [place] 01/10/23 [phone]

## 2023-01-10 NOTE — Discharge Instructions (Signed)
Please take the antibiotic eardrops as prescribed and until finished.  Do not get your external auditory canal wet, you can put a cotton swab anterior canal to help prevent this.  Your blood pressure was also high today, I recommend taking your blood pressure medication as prescribed and following up with your primary care provider for further evaluation.  Please seek immediate care if you develop a thunderclap headache, vision changes, or any new concerning symptoms.

## 2023-01-10 NOTE — ED Triage Notes (Signed)
Pt states she has right ear pain since Sunday. She has been taking IBU prn.

## 2023-04-21 DIAGNOSIS — H5213 Myopia, bilateral: Secondary | ICD-10-CM | POA: Diagnosis not present

## 2023-04-28 ENCOUNTER — Telehealth: Payer: Self-pay | Admitting: Pharmacist

## 2023-04-28 NOTE — Telephone Encounter (Signed)
Attempted to contact patient for follow-up of BP.  Left HIPAA compliant voice mail requesting call back to direct phone: [phone]  Total time with patient call and documentation of interaction: 3 minutes.

## 2023-04-30 ENCOUNTER — Encounter: Payer: Self-pay | Admitting: Pharmacist

## 2023-04-30 ENCOUNTER — Ambulatory Visit (INDEPENDENT_AMBULATORY_CARE_PROVIDER_SITE_OTHER): Payer: No Typology Code available for payment source | Admitting: Pharmacist

## 2023-04-30 VITALS — BP 160/87 | HR 112 | Ht 64.0 in | Wt 260.8 lb

## 2023-04-30 DIAGNOSIS — I10 Essential (primary) hypertension: Secondary | ICD-10-CM | POA: Diagnosis not present

## 2023-04-30 DIAGNOSIS — J309 Allergic rhinitis, unspecified: Secondary | ICD-10-CM

## 2023-04-30 DIAGNOSIS — R0781 Pleurodynia: Secondary | ICD-10-CM

## 2023-04-30 MED ORDER — FLUTICASONE PROPIONATE 50 MCG/ACT NA SUSP: 1.0000 | Freq: Every day | NASAL | 1 refills | Status: DC

## 2023-04-30 MED ORDER — OLMESARTAN MEDOXOMIL 20 MG PO TABS: 20.0000 mg | ORAL_TABLET | Freq: Every day | ORAL | 3 refills | Status: DC

## 2023-04-30 MED ORDER — LIDOCAINE 5 % EX PTCH: 1.0000 | MEDICATED_PATCH | CUTANEOUS | 0 refills | Status: DC

## 2023-04-30 NOTE — Progress Notes (Addendum)
S:     Chief Complaint  Patient presents with   Medication Management    Hypertension - Blood Pressure Evaluation   42 y.o. female who presents for hypertension evaluation, education, and management.   Patient reports stress is currently 10/10 as sister-in-law was diagnosed with Stage 4 bone cancer and is undergoing hospice care.  PMH is significant for HTN.  Patient was referred and last seen by Primary Care Provider, Dr. Jena Gauss, on 06/2022.   At last visit at in 06/2022 with Dr. Jena Gauss, patient was prescribed liraglutide for prediabetes and weight loss (was not picked up from pharmacy).  Today, patient arrives in good spirits and presents without any assistance. Patient reports headache. Denies dizziness, blurred vision, swelling.   Patient reports hypertension was diagnosed in 2014.   Family/Social history: father has HTN  Medication adherence suboptimal - patient reports not having taken hydrochlorothiazide till yesterday when patient was experiencing a headache. Patient has not taken BP medications today.   Current antihypertensives include: hydrochlorothiazide 25 mg once daily  Antihypertensives tried in the past include: none  Patient-reported exercise habits: works out twice a week for 30 mins -1 hour at the gym (treadmill, elliptical, bike, yoga class)  O:  Review of Systems  Neurological:  Positive for headaches.  All other systems reviewed and are negative.   Physical Exam Pulmonary:     Effort: Pulmonary effort is normal.  Neurological:     Mental Status: She is alert.  Psychiatric:        Mood and Affect: Mood normal.        Behavior: Behavior normal.        Thought Content: Thought content normal.        Judgment: Judgment normal.     Last 3 Office BP readings: BP Readings from Last 3 Encounters:  04/30/23 (!) 160/87  01/10/23 (!) 178/115  08/02/22 (!) 155/97    BMET    Component Value Date/Time   NA 138 08/02/2022 0447   NA 139  03/27/2022 1218   K 3.5 08/02/2022 0447   CL 106 08/02/2022 0447   CO2 24 08/02/2022 0447   GLUCOSE 110 (H) 08/02/2022 0447   BUN 9 08/02/2022 0447   BUN 9 03/27/2022 1218   CREATININE 0.68 08/02/2022 0447   CALCIUM 9.0 08/02/2022 0447   GFRNONAA >60 08/02/2022 0447   GFRAA >60 01/06/2018 1318    Renal function: CrCl cannot be calculated (Patient's most recent lab result is older than the maximum 21 days allowed.).  Clinical ASCVD: No  The 10-year ASCVD risk score (Arnett DK, et al., 2019) is: 8.6%   Values used to calculate the score:     Age: 89 years     Sex: Female     Is Non-Hispanic African American: Yes     Diabetic: No     Tobacco smoker: No     Systolic Blood Pressure: 160 mmHg     Is BP treated: Yes     HDL Cholesterol: 42 mg/dL     Total Cholesterol: 171 mg/dL   A/P: Hypertension diagnosed ~10 year ago uncontrolled on current medications. BP goal < 130/80 mmHg. Medication adherence appears suboptimal. Control is suboptimal due to non-adherence to hydrochlorothiazide 25 mg once daily.  -Restarted hydrochlorothiazide 25 mg once daily.  -Started olmesartan 20 mg once daily.  -Patient educated on purpose, proper use, and potential adverse effects.  -F/u labs ordered - BMET. - Consider lipid panel and UACR at next visit. -Counseled on  lifestyle modifications for blood pressure control including reduced dietary sodium, increased exercise, adequate sleep. -Encouraged patient to check BP at home and bring log of readings to next visit. Counseled on proper use of home BP cuff.   Refilled lidocaine 5% patch and Flonase (fluticasone) as per patient's request.  Results reviewed and written information provided.    Written patient instructions provided. Patient verbalized understanding of treatment plan.  Total time in face to face counseling 40 minutes.    Follow-up:  Pharmacist PRN. PCP clinic visit with Dr. Jena Gauss on 05/14/23.  Patient seen with Andee Poles,  PharmD Candidate.   BMET - results - normal values

## 2023-04-30 NOTE — Patient Instructions (Addendum)
It was nice to see you today!  Your goal blood pressure is <130/80 mmHg.  Medication Changes:  START olmesartan 20 mg once daily.  Refilled fluticasone and lidocaine for pick up.   Continue all other medication the same.   Monitor blood pressure at home daily and keep a log (on your phone or piece of paper) to bring with you to your next visit. Write down date, time, blood pressure and pulse.  Keep up the good work with diet and exercise. Aim for a diet full of vegetables, fruit and lean meats (chicken, [place], fish). Try to limit salt intake by eating fresh or frozen vegetables (instead of canned), rinse canned vegetables prior to cooking and do not add any additional salt to meals.

## 2023-05-01 LAB — BASIC METABOLIC PANEL
BUN/Creatinine Ratio: 10 (ref 9–23)
BUN: 8 mg/dL (ref 6–24)
CO2: 24 mmol/L (ref 20–29)
Calcium: 9.7 mg/dL (ref 8.7–10.2)
Chloride: 101 mmol/L (ref 96–106)
Creatinine, Ser: 0.77 mg/dL (ref 0.57–1.00)
Glucose: 93 mg/dL (ref 70–99)
Potassium: 4.1 mmol/L (ref 3.5–5.2)
Sodium: 139 mmol/L (ref 134–144)
eGFR: 99 mL/min/{1.73_m2} (ref >59)

## 2023-05-01 NOTE — Progress Notes (Signed)
Reviewed and agree with Dr Koval's plan.   

## 2023-05-05 ENCOUNTER — Telehealth: Payer: Self-pay

## 2023-05-05 NOTE — Telephone Encounter (Signed)
Pharmacy Patient Advocate Encounter   Received notification from CoverMyMeds that prior authorization for LIDOCAINE PATCHES is required/requested.   Insurance verification completed.    Per test claim: PA required; PA submitted to [facility] [place] Medicaid via CoverMyMeds Key/confirmation #/EOC [id number]. Status is pending  No chart notes of recent rib pain dx to attach.

## 2023-05-06 NOTE — Telephone Encounter (Signed)
Pharmacy Patient Advocate Encounter  Received notification from [facility] (MEDICAID) that Prior Authorization for LIDOCAINE 5% PATCHES has been DENIED.  LETTER SCANNED TO MEDIA. DENIAL REASONING IS EXTENSIVE.  Lidocaine 4% patches available OTC  PA #/Case ID/Reference #: [id number]

## 2023-05-07 ENCOUNTER — Other Ambulatory Visit: Payer: Self-pay

## 2023-05-07 NOTE — Progress Notes (Addendum)
Christine Parks 03-Dec-1980 [id number]  Patient outreached by Thomasene Ripple , PharmD Candidate on 05/07/23. Patient returned phone call.   Blood Pressure Readings: Last documented ambulatory systolic blood pressure: 160 Last documented ambulatory diastolic blood pressure: 87 Does the patient have a validated home blood pressure machine?: No (will send in script)  Medication review was performed. Is the patient taking their medications as prescribed?: Yes Differences from their prescribed list include: pt reports recently stopped taking olmesartan.   The following barriers to adherence were noted: Does the patient have cost concerns?: No Does the patient have transportation concerns?: No Does the patient need assistance obtaining refills?: No Does the patient occassionally forget to take some of their prescribed medications?: No Does the patient feel like one/some of their medications make them feel poorly?: Yes (olmesartan- making pt feel dizzy) Does the patient have questions or concerns about their medications?: No Does the patient have a follow up scheduled with their primary care provider/cardiologist?: Yes   Interventions: Interventions Completed: Medications were reviewed, Patient was educated on proper technique to check home blood pressure and reminded to bring home machine and readings to next provider appointment  The patient has follow up scheduled: 05/14/23 PCP: Alfredo Martinez, MD   Thomasene Ripple, Student-PharmD

## 2023-05-08 ENCOUNTER — Other Ambulatory Visit: Payer: Self-pay | Admitting: Student

## 2023-05-08 DIAGNOSIS — M549 Dorsalgia, unspecified: Secondary | ICD-10-CM

## 2023-05-08 MED ORDER — IBUPROFEN 800 MG PO TABS: 800.0000 mg | ORAL_TABLET | Freq: Three times a day (TID) | ORAL | 0 refills | Status: DC | PRN

## 2023-05-14 ENCOUNTER — Ambulatory Visit: Payer: No Typology Code available for payment source | Admitting: Student

## 2023-05-14 NOTE — Progress Notes (Deleted)
    SUBJECTIVE:   CHIEF COMPLAINT / HPI:   Hypertension:   today. Home medications include: hydrochlorothiazide 25 mg daily + Olmesartan 20 mg daily. She {Blank single:19197::"endorses","does not endorse"} taking these medications as prescribed. {Blank single:19197::"Does","Does not"} check blood pressure at home.*** Diet ***. Exercise ***. Most recent creatinine trend:  Lab Results  Component Value Date   CREATININE 0.77 04/30/2023   CREATININE 0.68 08/02/2022   CREATININE 0.77 03/27/2022   Patient has had a BMP in the past 1 year.   PERTINENT  PMH / PSH: ***  OBJECTIVE:   There were no vitals taken for this visit.  ***  ASSESSMENT/PLAN:   No problem-specific Assessment & Plan notes found for this encounter.     Alfredo Martinez, MD [facility]

## 2023-05-27 ENCOUNTER — Ambulatory Visit: Payer: No Typology Code available for payment source | Admitting: Student

## 2023-05-27 ENCOUNTER — Encounter: Payer: Self-pay | Admitting: Student

## 2023-05-27 DIAGNOSIS — I10 Essential (primary) hypertension: Secondary | ICD-10-CM | POA: Diagnosis not present

## 2023-05-27 NOTE — Patient Instructions (Addendum)
It was great to see you today! Thank you for choosing [facility] for your primary care.  Today we addressed: We will continue with the bp medications  Also, will update you with the medication options   If you haven't already, sign up for My Chart to have easy access to your labs results, and communication with your primary care physician. I recommend that you always bring your medications to each appointment as this makes it easy to ensure you are on the correct medications and helps [place] not miss refills when you need them. Call the clinic at [phone] if your symptoms worsen or you have any concerns. Return in about 3 months (around 08/27/2023). Please arrive 15 minutes before your appointment to ensure smooth check in process.  We appreciate your efforts in making this happen.  Thank you for allowing me to participate in your care, Alfredo Martinez, MD 05/27/2023, 10:46 AM PGY-3, [facility]

## 2023-05-27 NOTE — Progress Notes (Unsigned)
    SUBJECTIVE:   CHIEF COMPLAINT / HPI:   Hypertension: BP: 132/88 today. Home medications include: hydrochlorothiazide 25 mg daily. She {Blank single:19197::"endorses","does not endorse"} taking these medications as prescribed.   Most recent creatinine trend:  Lab Results  Component Value Date   CREATININE 0.77 04/30/2023   CREATININE 0.68 08/02/2022   CREATININE 0.77 03/27/2022   Patient has had a BMP in the past 1 year.   PERTINENT  PMH / PSH: ***  OBJECTIVE:   BP 132/88   Pulse 90   Ht 5\' 4"  (1.626 m)   Wt 258 lb (117 kg)   SpO2 100%   BMI 44.29 kg/m   ***  ASSESSMENT/PLAN:   No problem-specific Assessment & Plan notes found for this encounter.   Trial of topamax, metformin  Prediabetic  Higth cholesterol    Christine Martinez, MD [facility]

## 2023-05-28 ENCOUNTER — Telehealth: Payer: Self-pay

## 2023-05-28 NOTE — Assessment & Plan Note (Signed)
Well-controlled on hydrochlorothiazide 25 mg.  The patient does not need to continue with olmesartan as it was not beneficial for her.  Within range at this time.

## 2023-05-28 NOTE — Telephone Encounter (Signed)
[facility] calls nurse line in regards to DME order.   She reports they have faxed over paperwork for patient to obtain an at home blood pressure monitor.   Advised I did not see this in PCP box.   She reports she is refaxing this today.   Please let me know if this is not received or not wanted.   Will forward to PCP.

## 2023-05-28 NOTE — Assessment & Plan Note (Signed)
The patient has tried multiple types of weight loss medications and has been seen by the weight loss clinic here in [place] without benefit. Currently still working with nutrition and physical activity for weight loss. She suffers from prediabetes with insulin resistance, hypertension and would likely benefit from GLP-1 as she has been taking one already and has seen great benefit from it.  I will discuss this with our pharmacy technician to see if there is a GLP-1 that will be covered by insurance as she has multiple comorbidities that would resolve or improve with weight loss.  Discussed the side effects of GLP-1's and that she should call me if she has any side effects to this.  I will try to get her prescribed a GLP-1 instead of taking her father's medication.

## 2023-05-29 DIAGNOSIS — H5203 Hypermetropia, bilateral: Secondary | ICD-10-CM | POA: Diagnosis not present

## 2023-06-02 ENCOUNTER — Other Ambulatory Visit (HOSPITAL_COMMUNITY): Payer: Self-pay

## 2023-06-02 ENCOUNTER — Other Ambulatory Visit: Payer: Self-pay | Admitting: Student

## 2023-06-02 MED ORDER — WEGOVY 0.25 MG/0.5ML ~~LOC~~ SOAJ: 0.2500 mg | SUBCUTANEOUS | 2 refills | Status: DC

## 2023-06-03 ENCOUNTER — Telehealth: Payer: Self-pay

## 2023-06-03 NOTE — Telephone Encounter (Signed)
Patient calls nurse line in regards to [facility].   Patient reports she has not been able to pick this up from the pharmacy yet. She reports she was informed the medication needs a prior authorization.   Patient advised PAs are taking ~1-2 weeks to be processed. Advised I will send to pharmacy team and will call her with any updates.   Patient reports she would like to speak with PCP in regards to what they discussed during last OV. She reports she told her her friend gave her a Mounjaro 5mg  pen. She reports she would like to try this while she waits for insurance authorization.   Advised against staring Mounjaro without speaking to PCP first.   Will forward to PCP and [place].

## 2023-06-04 ENCOUNTER — Telehealth: Payer: Self-pay

## 2023-06-04 ENCOUNTER — Encounter: Payer: Self-pay | Admitting: Student

## 2023-06-04 ENCOUNTER — Other Ambulatory Visit (HOSPITAL_COMMUNITY): Payer: Self-pay

## 2023-06-04 NOTE — Telephone Encounter (Signed)
Pharmacy Patient Advocate Encounter   Received notification from Physician's Office that prior authorization for [facility] is required/requested.   PA required; PA submitted to [employer] via CoverMyMeds Key/confirmation #/EOC [facility]. Status is pending

## 2023-06-05 ENCOUNTER — Other Ambulatory Visit (HOSPITAL_COMMUNITY): Payer: Self-pay

## 2023-06-05 NOTE — Telephone Encounter (Signed)
Pharmacy Patient Advocate Encounter  Received notification from  [facility] [place]  that Prior Authorization for [facility] has been  DENIED. DRUG EXCLUDED FROM [facility]ERS PHARMACY BENEFIT.  Will attempt to submit to patients secondary insurance, managed medicaid. If approved, pharmacy may be able to skip primary coverage and bill to secondary.

## 2023-06-05 NOTE — Telephone Encounter (Signed)
PA for [facility] approved through 12/04/2023.  I called the pharmacy to give update. The medication is running through, however they have no stock of the medication right now.   Patient advised to shop around and to let us know which pharmacy she would like [place] to send it to.

## 2023-06-05 NOTE — Telephone Encounter (Signed)
Pharmacy Patient Advocate Encounter   PA required; PA submitted to [facility] MEDICAID via CoverMyMeds Key/confirmation #/EOC [id number]. Status is pending

## 2023-07-24 ENCOUNTER — Ambulatory Visit: Payer: No Typology Code available for payment source | Admitting: Family Medicine

## 2023-07-30 ENCOUNTER — Other Ambulatory Visit: Payer: Self-pay | Admitting: Student

## 2023-07-30 DIAGNOSIS — I10 Essential (primary) hypertension: Secondary | ICD-10-CM

## 2023-10-17 DIAGNOSIS — M542 Cervicalgia: Secondary | ICD-10-CM | POA: Diagnosis not present

## 2023-10-28 ENCOUNTER — Encounter: Payer: Self-pay | Admitting: Student

## 2023-10-28 ENCOUNTER — Other Ambulatory Visit (HOSPITAL_COMMUNITY): Payer: Self-pay

## 2023-10-28 ENCOUNTER — Ambulatory Visit (INDEPENDENT_AMBULATORY_CARE_PROVIDER_SITE_OTHER): Admitting: Student

## 2023-10-28 ENCOUNTER — Telehealth: Payer: Self-pay

## 2023-10-28 VITALS — BP 148/100 | HR 84 | Ht 64.0 in | Wt 250.0 lb

## 2023-10-28 DIAGNOSIS — E669 Obesity, unspecified: Secondary | ICD-10-CM

## 2023-10-28 DIAGNOSIS — I10 Essential (primary) hypertension: Secondary | ICD-10-CM

## 2023-10-28 DIAGNOSIS — E88819 Insulin resistance, unspecified: Secondary | ICD-10-CM | POA: Diagnosis not present

## 2023-10-28 DIAGNOSIS — Z6841 Body Mass Index (BMI) 40.0 and over, adult: Secondary | ICD-10-CM

## 2023-10-28 MED ORDER — WEGOVY 0.25 MG/0.5ML ~~LOC~~ SOAJ: 0.2500 mg | SUBCUTANEOUS | 1 refills | Status: DC

## 2023-10-28 MED ORDER — LOSARTAN POTASSIUM 25 MG PO TABS: 25.0000 mg | ORAL_TABLET | Freq: Every day | ORAL | 0 refills | Status: DC

## 2023-10-28 MED ORDER — WEGOVY 0.25 MG/0.5ML ~~LOC~~ SOAJ
0.2500 mg | SUBCUTANEOUS | 1 refills | Status: DC
  Filled 2023-10-28 (×3): qty 2, 28d supply, fill #0
  Filled 2023-11-25: qty 2, 28d supply, fill #1

## 2023-10-28 NOTE — Telephone Encounter (Signed)
 Patient calls nurse line stating Christine Parks needs a PA.   Patient advised PAs for weight loss drugs can take up to 2-3 weeks.   Will forward to pharmacy team.

## 2023-10-28 NOTE — Assessment & Plan Note (Signed)
 Hypertension poorly controlled due to side effects from HCTZ. Losartan considered to avoid diuretic side effects. - Discontinue HCTZ. - Initiate losartan for hypertension management. - Check kidney function today and in four weeks. - Instruct her to report any adverse effects from losartan.

## 2023-10-28 NOTE — Progress Notes (Signed)
    SUBJECTIVE:   CHIEF COMPLAINT / HPI:   Weight Loss Discussion:  - The patient, with a history of obesity and hypertension, presents with recent weight gain and increased appetite.  - She reports that she was previously on a weight loss medication, Mounjaro, which she stopped taking about a month ago.  - Since discontinuing the medication, she has noticed an increase in her appetite and snacking habits, which her husband has also observed.  - She reports that her weight was previously down to 242 pounds, but has since increased to 250 pounds, which she finds discouraging.  HTN: - In addition to her weight concerns, the patient also mentions that her blood pressure was high today. S - he admits to not taking her hydrochlorothiazide as prescribed due to frequent urination, a known side effect of the medication.  - She expresses willingness to try a different blood pressure medication to better manage her hypertension.  PERTINENT  PMH / PSH: Reviewed   OBJECTIVE:   BP (!) 148/100   Pulse 84   Ht 5\' 4"  (1.626 m)   Wt 250 lb (113.4 kg)   SpO2 100%   BMI 42.91 kg/m   General: Alert and oriented in no apparent distress Heart: Regular rate and rhythm with no murmurs appreciated Lungs: CTA bilaterally, no wheezing Abdomen: Bowel sounds present, no abdominal pain Skin: Warm and dry Extremities: No lower extremity edema   ASSESSMENT/PLAN:   Assessment & Plan Body mass index (BMI) 50.0-59.9, adult (HCC) Obesity with recent weight gain. Previously on Mounjaro with good results, but insurance issues led to discontinuation. Reginal Lutes considered as an alternative due to better insurance coverage and efficacy. - Send prescription for Wegovy to [facility] outpatient pharmacy. - Instruct her to contact clinic if insurance issues arise with [facility]. - Plan to increase Wegovy dosage if well tolerated, potentially reaching maximum dose. - Monitor weight and appetite response to  [facility]. Essential hypertension Hypertension poorly controlled due to side effects from HCTZ. Losartan considered to avoid diuretic side effects. - Discontinue HCTZ. - Initiate losartan for hypertension management. - Check kidney function today and in four weeks. - Instruct her to report any adverse effects from losartan.  General Health Maintenance Due for Pap smear and up to date on vaccinations. Fasting for potential lab work. - Perform Pap smear in four weeks. - Check lipid panel today with BMP    Alfredo Martinez, MD [facility]

## 2023-10-28 NOTE — Assessment & Plan Note (Signed)
 Obesity with recent weight gain. Previously on Mounjaro with good results, but insurance issues led to discontinuation. Reginal Lutes considered as an alternative due to better insurance coverage and efficacy. - Send prescription for Wegovy to [facility] outpatient pharmacy. - Instruct her to contact clinic if insurance issues arise with [facility]. - Plan to increase Wegovy dosage if well tolerated, potentially reaching maximum dose. - Monitor weight and appetite response to [facility].

## 2023-10-28 NOTE — Patient Instructions (Addendum)
 It was great to see you today! Thank you for choosing [facility] for your primary care.  Today we addressed: Losartan 25 mg STOP hydrochlorothiazide We will check kidneys today  Medication for Wegovy is at  [address]  [place] [address]  Return in 4 weeks   If you haven't already, sign up for My Chart to have easy access to your labs results, and communication with your primary care physician.   Please arrive 15 minutes before your appointment to ensure smooth check in process.  We appreciate your efforts in making this happen.  Thank you for allowing me to participate in your care, Alfredo Martinez, MD 10/28/2023, 11:49 AM PGY-3, [facility]

## 2023-10-29 LAB — BASIC METABOLIC PANEL
BUN/Creatinine Ratio: 15 (ref 9–23)
BUN: 11 mg/dL (ref 6–24)
CO2: 22 mmol/L (ref 20–29)
Calcium: 9.2 mg/dL (ref 8.7–10.2)
Chloride: 101 mmol/L (ref 96–106)
Creatinine, Ser: 0.72 mg/dL (ref 0.57–1.00)
Glucose: 81 mg/dL (ref 70–99)
Potassium: 4.8 mmol/L (ref 3.5–5.2)
Sodium: 139 mmol/L (ref 134–144)
eGFR: 107 mL/min/{1.73_m2} (ref >59)

## 2023-10-29 LAB — LIPID PANEL
Chol/HDL Ratio: 4.3 ratio (ref 0.0–4.4)
Cholesterol, Total: 200 mg/dL — ABNORMAL HIGH (ref 100–199)
HDL: 46 mg/dL (ref >39)
LDL Chol Calc (NIH): 141 mg/dL — ABNORMAL HIGH (ref 0–99)
Triglycerides: 73 mg/dL (ref 0–149)
VLDL Cholesterol Cal: 13 mg/dL (ref 5–40)

## 2023-10-30 ENCOUNTER — Encounter: Payer: Self-pay | Admitting: Student

## 2023-10-31 ENCOUNTER — Telehealth: Payer: Self-pay

## 2023-10-31 NOTE — Telephone Encounter (Signed)
 Pharmacy Patient Advocate Encounter   Received notification from CoverMyMeds that prior authorization for [facility] 0.25MG  is required/requested.   Insurance verification completed.   The patient is insured through  [place]  / [employer].   PA required; PA submitted to above mentioned insurance via CoverMyMeds Key/confirmation #/EOC [id number]. Status is pending

## 2023-11-04 NOTE — Telephone Encounter (Signed)
 Pharmacy Patient Advocate Encounter  Received notification from [facility] that Prior Authorization for [facility] has been DENIED.  Full denial letter will be uploaded to the media tab. See denial reason below.  We denied your request because this drug [facility]) is not a benefit on your plan when used for weight loss alone, it is not covered.  PA #/Case ID/Reference #: [id number]

## 2023-11-06 NOTE — Telephone Encounter (Signed)
 See other phone note

## 2023-11-07 ENCOUNTER — Other Ambulatory Visit (HOSPITAL_COMMUNITY): Payer: Self-pay

## 2023-11-07 MED ORDER — AMLODIPINE BESYLATE 5 MG PO TABS
5.0000 mg | ORAL_TABLET | Freq: Every day | ORAL | 0 refills | Status: DC
  Filled 2023-11-07: qty 30, 30d supply, fill #0

## 2023-11-18 ENCOUNTER — Other Ambulatory Visit (HOSPITAL_COMMUNITY): Payer: Self-pay

## 2023-11-18 ENCOUNTER — Encounter: Payer: Self-pay | Admitting: Student

## 2023-11-18 ENCOUNTER — Ambulatory Visit: Payer: Self-pay | Admitting: Student

## 2023-11-18 VITALS — BP 136/83 | HR 92 | Ht 64.0 in | Wt 249.0 lb

## 2023-11-18 DIAGNOSIS — Z6841 Body Mass Index (BMI) 40.0 and over, adult: Secondary | ICD-10-CM | POA: Diagnosis present

## 2023-11-18 DIAGNOSIS — Z713 Dietary counseling and surveillance: Secondary | ICD-10-CM | POA: Diagnosis not present

## 2023-11-18 DIAGNOSIS — E88819 Insulin resistance, unspecified: Secondary | ICD-10-CM

## 2023-11-18 DIAGNOSIS — Z7182 Exercise counseling: Secondary | ICD-10-CM

## 2023-11-18 MED ORDER — WEGOVY 1 MG/0.5ML ~~LOC~~ SOAJ
1.0000 mg | SUBCUTANEOUS | 1 refills | Status: DC
  Filled 2023-11-18: qty 2, 28d supply, fill #0
  Filled 2023-12-16 – 2023-12-23 (×3): qty 2, 28d supply, fill #1

## 2023-11-18 MED ORDER — WEGOVY 1 MG/0.5ML ~~LOC~~ SOAJ
1.0000 mg | SUBCUTANEOUS | 1 refills | Status: DC
  Filled 2023-11-18: qty 2, fill #0

## 2023-11-18 NOTE — Progress Notes (Signed)
    SUBJECTIVE:   CHIEF COMPLAINT / HPI:   Weight Discussion  HTN: The patient, with a history of hypertension, presents for a follow-up visit. She reports dissatisfaction with her previous medication, losartan, due to severe side effects including lightheadedness and dizziness. She describes an incident where she felt so lightheaded and dizzy that she thought she might need emergency medical services. She also mentions that her father, who has kidney disease, had to stop taking losartan due to similar side effects.  The patient has since switched to amlodipine, which she prefers and reports no adverse effects. However, she acknowledges that her blood pressure is still not adequately controlled on the current dose. She expresses willingness to increase the dosage under the guidance of her doctor.  The patient also mentions taking a weight loss medication, which she reports is not as effective as a previous medication she was unable to get covered by her insurance. She is interested in increasing the dosage of this medication as well.  PERTINENT  PMH / PSH: Reviewed   OBJECTIVE:   BP 136/83   Pulse 92   Ht 5\' 4"  (1.626 m)   Wt 249 lb (112.9 kg)   SpO2 100%   BMI 42.74 kg/m   General: Alert and oriented in no apparent distress Heart: Regular rate and rhythm with no murmurs appreciated Lungs: Normal WOB Skin: Warm and dry   ASSESSMENT/PLAN:   Assessment & Plan Body mass index (BMI) 50.0-59.9, adult (HCC) Obesity and trialed multiple different medications including maximum tolerated Metformin in the past without benefit. BMI 42, continuing with adjunct weight loss efforts of dietary changes and exercise. Previously on Mounjaro with good results, but insurance issues led to discontinuation. Reginal Lutes considered as an alternative due to better insurance coverage and efficacy.    Hypertension Hypertension managed with amlodipine, better tolerated than losartan.  - Amlodipine  - Monitor  blood pressure and renal function regularly.  Alfredo Martinez, MD [facility]

## 2023-11-18 NOTE — Patient Instructions (Addendum)
 It was great to see you today! Thank you for choosing [facility] for your primary care.  Today we addressed: For blood pressure - continue amlodipine  For weight loss - increase wegovy to 1 mg   If you haven't already, sign up for My Chart to have easy access to your labs results, and communication with your primary care physician.  Follow up in 2 months  Please arrive 15 minutes before your appointment to ensure smooth check in process.  We appreciate your efforts in making this happen.  Thank you for allowing me to participate in your care, Alfredo Martinez, MD 11/18/2023, 9:51 AM PGY-3, [facility]

## 2023-11-18 NOTE — Assessment & Plan Note (Signed)
 Obesity and trialed multiple different medications including maximum tolerated Metformin in the past without benefit. BMI 42, continuing with adjunct weight loss efforts of dietary changes and exercise. Previously on Mounjaro with good results, but insurance issues led to discontinuation. Reginal Lutes considered as an alternative due to better insurance coverage and efficacy.

## 2023-11-25 ENCOUNTER — Other Ambulatory Visit (HOSPITAL_COMMUNITY): Payer: Self-pay

## 2023-11-26 ENCOUNTER — Other Ambulatory Visit (HOSPITAL_COMMUNITY): Payer: Self-pay

## 2023-12-16 ENCOUNTER — Telehealth: Admitting: Physician Assistant

## 2023-12-16 ENCOUNTER — Encounter: Payer: Self-pay | Admitting: Physician Assistant

## 2023-12-16 ENCOUNTER — Other Ambulatory Visit: Payer: Self-pay | Admitting: Student

## 2023-12-16 ENCOUNTER — Other Ambulatory Visit (HOSPITAL_COMMUNITY): Payer: Self-pay

## 2023-12-16 DIAGNOSIS — R3989 Other symptoms and signs involving the genitourinary system: Secondary | ICD-10-CM

## 2023-12-16 MED ORDER — PYRIDIUM 100 MG PO TABS: 100.0000 mg | ORAL_TABLET | Freq: Three times a day (TID) | ORAL | 0 refills | Status: DC | PRN

## 2023-12-16 MED ORDER — NITROFURANTOIN MONOHYD MACRO 100 MG PO CAPS: 100.0000 mg | ORAL_CAPSULE | Freq: Two times a day (BID) | ORAL | 0 refills | Status: DC

## 2023-12-16 MED ORDER — PHENAZOPYRIDINE HCL 100 MG PO TABS: 100.0000 mg | ORAL_TABLET | Freq: Three times a day (TID) | ORAL | 0 refills | Status: DC | PRN

## 2023-12-16 MED ORDER — AMLODIPINE BESYLATE 5 MG PO TABS
5.0000 mg | ORAL_TABLET | Freq: Every day | ORAL | 0 refills | Status: DC
  Filled 2023-12-16: qty 30, 30d supply, fill #0

## 2023-12-16 NOTE — Patient Instructions (Signed)
 Christine Parks, thank you for joining Angelia Kelp, PA-C for today's virtual visit.  While this provider is not your primary care provider (PCP), if your PCP is located in our provider database this encounter information will be shared with them immediately following your visit.   A [facility] MyChart account gives you access to today's visit and all your visits, tests, and labs performed at [facility] " click here if you don't have a [facility] MyChart account or go to [email or web address]  Consent: (Patient) Christine Parks provided verbal consent for this virtual visit at the beginning of the encounter.  Current Medications:  Current Outpatient Medications:    nitrofurantoin, macrocrystal-monohydrate, (MACROBID) 100 MG capsule, Take 1 capsule (100 mg total) by mouth 2 (two) times daily., Disp: 10 capsule, Rfl: 0   phenazopyridine  (PYRIDIUM ) 100 MG tablet, Take 1 tablet (100 mg total) by mouth 3 (three) times daily as needed for pain., Disp: 10 tablet, Rfl: 0   amLODipine  (NORVASC ) 5 MG tablet, Take 1 tablet (5 mg total) by mouth at bedtime., Disp: 30 tablet, Rfl: 0   fluticasone  (FLONASE ) 50 MCG/ACT nasal spray, Place 1 spray into both nostrils daily., Disp: 48 mL, Rfl: 1   ibuprofen  (ADVIL ) 800 MG tablet, Take 1 tablet (800 mg total) by mouth every 8 (eight) hours as needed., Disp: 30 tablet, Rfl: 0   lidocaine  (LIDODERM ) 5 %, Place 1 patch onto the skin daily. Remove & Discard patch within 12 hours or as directed by MD, Disp: 10 patch, Rfl: 0   meclizine  (ANTIVERT ) 25 MG tablet, Take 1 tablet (25 mg total) by mouth 3 (three) times daily as needed for dizziness., Disp: 30 tablet, Rfl: 0   Semaglutide -Weight Management (WEGOVY ) 0.25 MG/0.5ML SOAJ, Inject 0.25 mg into the skin once a week., Disp: 2 mL, Rfl: 1   Semaglutide -Weight Management (WEGOVY ) 1 MG/0.5ML SOAJ, Inject 1 mg into the skin once a week., Disp: 2 mL, Rfl: 1   Medications ordered in this  encounter:  Meds ordered this encounter  Medications   nitrofurantoin, macrocrystal-monohydrate, (MACROBID) 100 MG capsule    Sig: Take 1 capsule (100 mg total) by mouth 2 (two) times daily.    Dispense:  10 capsule    Refill:  0    Supervising Provider:   LAMPTEY, PHILIP O [[id number]]   phenazopyridine  (PYRIDIUM ) 100 MG tablet    Sig: Take 1 tablet (100 mg total) by mouth 3 (three) times daily as needed for pain.    Dispense:  10 tablet    Refill:  0    Supervising Provider:   Corine Dice [[id number]]     *If you need refills on other medications prior to your next appointment, please contact your pharmacy*  Follow-Up: Call back or seek an in-person evaluation if the symptoms worsen or if the condition fails to improve as anticipated.  [facility] [phone]  Other Instructions Urinary Tract Infection, Female A urinary tract infection (UTI) is an infection in your urinary tract. The urinary tract is made up of organs that make, store, and get rid of pee (urine) in your body. These organs include: The kidneys. The ureters. The bladder. The urethra. What are the causes? Most UTIs are caused by germs called bacteria. They may be in or near your genitals. These germs grow and cause swelling in your urinary tract. What increases the risk? You're more likely to get a UTI if: You're a female. The urethra is shorter in  females than in males. You have a soft tube called a catheter that drains your pee. You can't control when you pee or poop. You have trouble peeing because of: A kidney stone. A urinary blockage. A nerve condition that affects your bladder. Not getting enough to drink. You're sexually active. You use a birth control inside your vagina, like spermicide. You're pregnant. You have low levels of the hormone estrogen in your body. You're an older adult. You're also more likely to get a UTI if you have other health problems. These may  include: Diabetes. A weak immune system. Your immune system is your body's defense system. Sickle cell disease. Injury of the spine. What are the signs or symptoms? Symptoms may include: Needing to pee right away. Peeing small amounts often. Pain or burning when you pee. Blood in your pee. Pee that smells bad or odd. Pain in your belly or lower back. You may also: Feel confused. This may be the first symptom in older adults. Vomit. Not feel hungry. Feel tired or easily annoyed. Have a fever or chills. How is this diagnosed? A UTI is diagnosed based on your medical history and an exam. You may also have other tests. These may include: Pee tests. Blood tests. Tests for sexually transmitted infections (STIs). If you've had more than one UTI, you may need to have imaging studies done to find out why you keep getting them. How is this treated? A UTI can be treated by: Taking antibiotics or other medicines. Drinking enough fluid to keep your pee pale yellow. In rare cases, a UTI can cause a very bad condition called sepsis. Sepsis may be treated in the hospital. Follow these instructions at home: Medicines Take your medicines only as told by your health care provider. If you were given antibiotics, take them as told by your provider. Do not stop taking them even if you start to feel better. General instructions Make sure you: Pee often and fully. Do not hold your pee for a long time. Wipe from front to back after you pee or poop. Use each tissue only once when you wipe. Pee after you have sex. Do not douche or use sprays or powders in your genital area. Contact a health care provider if: Your symptoms don't get better after 1-2 days of taking antibiotics. Your symptoms go away and then come back. You have a fever or chills. You vomit or feel like you may vomit. Get help right away if: You have very bad pain in your back or lower belly. You faint. This information is not  intended to replace advice given to you by your health care provider. Make sure you discuss any questions you have with your health care provider. Document Revised: 03/06/2023 Document Reviewed: 11/01/2022 Elsevier Patient Education  2024 Elsevier Inc.   If you have been instructed to have an in-person evaluation today at a local Urgent Care facility, please use the link below. It will take you to a list of all of our available [facility], including address, phone number and hours of operation. Please do not delay care.  [facility]  If you or a family member do not have a primary care provider, use the link below to schedule a visit and establish care. When you choose a [facility] primary care physician or advanced practice provider, you gain a long-term partner in health. Find a Primary Care Provider  Learn more about 's in-office and virtual care options:  [place] - Get Care Now

## 2023-12-16 NOTE — Progress Notes (Signed)
 Virtual Visit Consent   Christine Parks, you are scheduled for a virtual visit with a [facility] provider today. Just as with appointments in the office, your consent must be obtained to participate. Your consent will be active for this visit and any virtual visit you may have with one of our providers in the next 365 days. If you have a MyChart account, a copy of this consent can be sent to you electronically.  As this is a virtual visit, video technology does not allow for your provider to perform a traditional examination. This may limit your provider's ability to fully assess your condition. If your provider identifies any concerns that need to be evaluated in person or the need to arrange testing (such as labs, EKG, etc.), we will make arrangements to do so. Although advances in technology are sophisticated, we cannot ensure that it will always work on either your end or our end. If the connection with a video visit is poor, the visit may have to be switched to a telephone visit. With either a video or telephone visit, we are not always able to ensure that we have a secure connection.  By engaging in this virtual visit, you consent to the provision of healthcare and authorize for your insurance to be billed (if applicable) for the services provided during this visit. Depending on your insurance coverage, you may receive a charge related to this service.  I need to obtain your verbal consent now. Are you willing to proceed with your visit today? [name] has provided verbal consent on 12/16/2023 for a virtual visit (video or telephone). Angelia Kelp, PA-C  Date: 12/16/2023 2:27 PM   Virtual Visit via Video Note   I, Angelia Kelp, connected with  Christine Parks  ([id number], 02/08/1981) on 12/16/23 at  2:30 PM EDT by a video-enabled telemedicine application and verified that I am speaking with the correct person using two identifiers.  Location: Patient: Virtual  Visit Location Patient: Home Provider: Virtual Visit Location Provider: Home Office   I discussed the limitations of evaluation and management by telemedicine and the availability of in person appointments. The patient expressed understanding and agreed to proceed.    History of Present Illness: Christine Parks is a 43 y.o. who identifies as a female who was assigned female at birth, and is being seen today for dysuria, urgency.  HPI: Urinary Tract Infection  This is a new problem. The current episode started yesterday. The problem occurs every urination. The problem has been unchanged. The quality of the pain is described as burning. The pain is mild. There has been no fever. Associated symptoms include frequency, hesitancy, nausea (also on wegovy ) and urgency. Pertinent negatives include no chills, discharge, flank pain, hematuria or vomiting. She has tried nothing for the symptoms. The treatment provided no relief. Her past medical history is significant for recurrent UTIs.     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   Menopause 03/27/2022   Vitamin D deficiency 03/27/2022   Weight loss 01/25/2022   Morbid obesity (HCC) 11/17/2020   Abnormal EKG 01/15/2018   Essential hypertension 10/31/2016   Palpitations 10/31/2016    Allergies:  Allergies  Allergen Reactions   Penicillins Swelling and Rash    Has patient had a PCN reaction causing immediate rash, facial/tongue/throat swelling, SOB or lightheadedness with hypotension: Unknown Has patient had a PCN reaction causing severe rash involving mucus membranes or skin necrosis: Unknown Has patient had a PCN reaction that required hospitalization:  Unknown Has patient had a PCN reaction occurring within the last 10 years: Unknown If all of the above answers are "NO", then may proceed with Cephalosporin use.    Medications:  Current Outpatient Medications:    nitrofurantoin, macrocrystal-monohydrate, (MACROBID) 100 MG capsule, Take 1 capsule (100 mg total) by mouth 2 (two) times daily., Disp: 10 capsule, Rfl: 0   phenazopyridine  (PYRIDIUM ) 100 MG tablet, Take 1 tablet (100 mg total) by mouth 3 (three) times daily as needed for pain., Disp: 10 tablet, Rfl: 0   amLODipine  (NORVASC ) 5 MG tablet, Take 1 tablet (5 mg total) by mouth at bedtime., Disp: 30 tablet, Rfl: 0   fluticasone  (FLONASE ) 50 MCG/ACT nasal spray, Place 1 spray into both nostrils daily., Disp: 48 mL, Rfl: 1   ibuprofen  (ADVIL ) 800 MG tablet, Take 1 tablet (800 mg total) by mouth every 8 (eight) hours as needed., Disp: 30 tablet, Rfl: 0   lidocaine  (LIDODERM ) 5 %, Place 1 patch onto the skin daily. Remove & Discard patch within 12 hours or as directed by MD, Disp: 10 patch, Rfl: 0   meclizine  (ANTIVERT ) 25 MG tablet, Take 1 tablet (25 mg total) by mouth 3 (three) times daily as needed for dizziness., Disp: 30 tablet, Rfl: 0   Semaglutide -Weight Management (WEGOVY ) 0.25 MG/0.5ML SOAJ, Inject 0.25 mg into the skin once a week., Disp: 2 mL, Rfl: 1   Semaglutide -Weight Management (WEGOVY ) 1 MG/0.5ML SOAJ, Inject 1 mg into the skin once a week., Disp: 2 mL, Rfl: 1  Observations/Objective: Patient is well-developed, well-nourished in no acute distress.  Resting comfortably at home.  Head is normocephalic, atraumatic.  No labored breathing.  Speech is clear and coherent with logical content.  Patient is alert and oriented at baseline.    Assessment and Plan: 1. Suspected UTI (Primary) - nitrofurantoin, macrocrystal-monohydrate, (MACROBID) 100 MG capsule; Take 1 capsule (100 mg total) by mouth 2 (two) times daily.  Dispense: 10 capsule; Refill: 0 - phenazopyridine  (PYRIDIUM )  100 MG tablet; Take 1 tablet (100 mg total) by mouth 3 (three) times daily as needed for pain.  Dispense: 10 tablet; Refill: 0  - Worsening symptoms.  - Will treat empirically with Macrobid - May use Pyridium  for bladder spasms - Continue to push fluids.  - Seek in person evaluation for urine culture if symptoms do not improve or if they worsen.    Follow Up Instructions: I discussed the assessment and treatment plan with the patient. The patient was provided an opportunity to ask questions and all were answered. The patient agreed with the plan and demonstrated an understanding of the instructions.  A copy of instructions were sent to the patient via MyChart unless otherwise noted below.    The patient was advised to call back or seek an in-person evaluation if the symptoms worsen or if the condition fails to improve as anticipated.    Angelia Kelp, PA-C

## 2023-12-16 NOTE — Addendum Note (Signed)
 Addended by: Angelia Kelp on: 12/16/2023 04:03 PM   Modules accepted: Orders

## 2023-12-18 ENCOUNTER — Other Ambulatory Visit (HOSPITAL_COMMUNITY): Payer: Self-pay

## 2023-12-18 ENCOUNTER — Telehealth: Payer: Self-pay

## 2023-12-18 NOTE — Telephone Encounter (Signed)
 Pharmacy Patient Advocate Encounter   Received notification from CoverMyMeds that prior authorization for  WEGOVY  1MG  is required/requested.   Insurance verification completed.   The patient is insured through [facility] MEDICAID .   PA required; PA submitted to above mentioned insurance via CoverMyMeds Key/confirmation #/EOC [id number]. Status is pending

## 2023-12-18 NOTE — Telephone Encounter (Signed)
 CMM NOT PROCESSING ELECTRONICALLY.   FAXED PAPER PA FORM.

## 2023-12-19 ENCOUNTER — Encounter: Payer: Self-pay | Admitting: Student

## 2023-12-19 ENCOUNTER — Other Ambulatory Visit (HOSPITAL_COMMUNITY): Payer: Self-pay

## 2023-12-19 NOTE — Telephone Encounter (Signed)
 Patient calls nurse line in regards to Wegovy  1mg .  She reports she is "confused" on why this was denied.   She reports she has been taking this medication for 1 month. She reports she picked this up with her insurance with no problem.   Advised will forward to pharmacy team for more information.

## 2023-12-19 NOTE — Telephone Encounter (Signed)
 See other phone note

## 2023-12-19 NOTE — Telephone Encounter (Signed)
 Pharmacy Patient Advocate Encounter  Received notification from [facility] MEDICAID that Prior Authorization for WEGOVY  1MG  has been DENIED.  Full denial letter will be uploaded to the media tab. See denial reason below.

## 2023-12-22 NOTE — Telephone Encounter (Signed)
 Patient has an apt with PCP scheduled for tomorrow.   We can see weight then.

## 2023-12-22 NOTE — Telephone Encounter (Signed)
 Patient calls nurse line again in regards to Wegovy .  She reports she has been getting the 1mg  for ~ 1 month, so she is unsure why her insurance is denying the medication now.   She reports she called her insurance company and they stated we could appeal due to "stock issues."  She reports she was unable to get the medication during the initial window of PA due to medication being out of stock.   Will forward to pharmacy to see if this is possible.

## 2023-12-23 ENCOUNTER — Ambulatory Visit: Admitting: Student

## 2023-12-23 ENCOUNTER — Other Ambulatory Visit (HOSPITAL_COMMUNITY): Payer: Self-pay

## 2023-12-23 NOTE — Progress Notes (Deleted)
    SUBJECTIVE:   CHIEF COMPLAINT / HPI:   Obesity:  Currently on Wegovy   Initial Weight 258 lb BMI 44.29  PERTINENT  PMH / PSH:  Prediabetes, vitamin D deficiency, insulin  resistance, hypertension, obesity   OBJECTIVE:   There were no vitals taken for this visit.  ***  ASSESSMENT/PLAN:   Assessment & Plan      Christine Headland, MD [facility]

## 2023-12-24 ENCOUNTER — Other Ambulatory Visit (HOSPITAL_COMMUNITY): Payer: Self-pay

## 2023-12-26 ENCOUNTER — Ambulatory Visit: Admitting: Student

## 2023-12-26 ENCOUNTER — Other Ambulatory Visit (HOSPITAL_COMMUNITY): Payer: Self-pay

## 2024-01-01 ENCOUNTER — Other Ambulatory Visit (HOSPITAL_COMMUNITY): Payer: Self-pay

## 2024-01-01 ENCOUNTER — Encounter: Payer: Self-pay | Admitting: Student

## 2024-01-01 ENCOUNTER — Ambulatory Visit (INDEPENDENT_AMBULATORY_CARE_PROVIDER_SITE_OTHER): Admitting: Student

## 2024-01-01 ENCOUNTER — Ambulatory Visit: Admitting: Student

## 2024-01-01 DIAGNOSIS — Z6841 Body Mass Index (BMI) 40.0 and over, adult: Secondary | ICD-10-CM | POA: Diagnosis not present

## 2024-01-01 DIAGNOSIS — Z0000 Encounter for general adult medical examination without abnormal findings: Secondary | ICD-10-CM | POA: Diagnosis not present

## 2024-01-01 MED ORDER — CYCLOBENZAPRINE HCL 5 MG PO TABS
5.0000 mg | ORAL_TABLET | Freq: Three times a day (TID) | ORAL | 0 refills | Status: DC | PRN
  Filled 2024-01-01: qty 20, 4d supply, fill #0

## 2024-01-01 MED ORDER — AMLODIPINE BESYLATE 10 MG PO TABS
10.0000 mg | ORAL_TABLET | Freq: Every day | ORAL | 0 refills | Status: AC
  Filled 2024-01-01 – 2024-03-26 (×2): qty 30, 30d supply, fill #0

## 2024-01-01 NOTE — Patient Instructions (Signed)
 It was great to see you today! Thank you for choosing [facility] for your primary care.  Today we addressed: Flexeril  three times a day as needed, can make you sleepy I will send for another prior auth Come back in 1 month or earlier if pain persists  PT will call you   If you haven't already, sign up for My Chart to have easy access to your labs results, and communication with your primary care physician.   Please arrive 15 minutes before your appointment to ensure smooth check in process.  We appreciate your efforts in making this happen.  Thank you for allowing me to participate in your care, Ernestina Headland, MD 01/01/2024, 2:28 PM PGY-3, [facility]

## 2024-01-01 NOTE — Assessment & Plan Note (Signed)
 Patient previously on 0.25 of wegovy  but has been off of this for a few weeks due to difficulty with insurance coverage.  They were tolerating this well  Current weight: 250 lb Weight loss trend: initial weight a few months ago 260 lb but has been steadily losing weight until unable to pick up GLP1 The patient has been making the following dietary changes monitoring eating habits, increasing veggies and fruits, decreasing fast foods.  The patient has increasing physical activity through the following: walking and regular exercise The patient denies a personal history of eating disorder, gastroparesis  or pancreatitis. No family history of medullary thyroid cancer. No symptoms of gallstones.  There is no a chance of pregnancy given patient's history/age Needs increased dosage for continued weight loss benefit in addition to exercise and dietary changes
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
## 2024-01-01 NOTE — Progress Notes (Deleted)
    SUBJECTIVE:   CHIEF COMPLAINT / HPI:   Obesity  GLP1 Follow up:    PERTINENT  PMH / PSH: ***  OBJECTIVE:   There were no vitals taken for this visit.  ***  ASSESSMENT/PLAN:   Assessment & Plan      Ernestina Headland, MD [facility]

## 2024-01-02 ENCOUNTER — Other Ambulatory Visit (HOSPITAL_COMMUNITY): Payer: Self-pay

## 2024-01-02 ENCOUNTER — Telehealth: Payer: Self-pay

## 2024-01-02 MED ORDER — WEGOVY 0.5 MG/0.5ML ~~LOC~~ SOAJ
0.5000 mg | SUBCUTANEOUS | 2 refills | Status: DC
  Filled 2024-01-02 – 2024-01-20 (×3): qty 2, 28d supply, fill #0

## 2024-01-02 NOTE — Addendum Note (Signed)
 Addended by: Ernestina Headland on: 01/02/2024 09:36 AM   Modules accepted: Orders

## 2024-01-02 NOTE — Telephone Encounter (Signed)
 Pharmacy Patient Advocate Encounter   Received notification from CoverMyMeds that prior authorization for WEGOVY  0.5MG  is required/requested.   Insurance verification completed.   The patient is insured through [facility] MEDICAID .   Per test claim: PA required; PA submitted to above mentioned insurance via CoverMyMeds Key/confirmation #/EOC [facility]. Status is pending

## 2024-01-02 NOTE — Telephone Encounter (Signed)
New PA encounter created

## 2024-01-02 NOTE — Telephone Encounter (Signed)
 Resubmitted PA using paper form. PA request stuck on CMM.

## 2024-01-08 ENCOUNTER — Other Ambulatory Visit (HOSPITAL_COMMUNITY): Payer: Self-pay

## 2024-01-09 NOTE — Telephone Encounter (Signed)
 Pharmacy Patient Advocate Encounter  Received notification from [facility] MEDICAID that Prior Authorization for WEGOVY  has been DENIED.  Full denial letter will be uploaded to the media tab. See denial reason below.    PA #/Case ID/Reference #: [id number]

## 2024-01-13 ENCOUNTER — Other Ambulatory Visit (HOSPITAL_COMMUNITY): Payer: Self-pay

## 2024-01-15 ENCOUNTER — Ambulatory Visit: Attending: Family Medicine | Admitting: Physical Therapy

## 2024-01-15 ENCOUNTER — Encounter: Payer: Self-pay | Admitting: Physical Therapy

## 2024-01-15 ENCOUNTER — Other Ambulatory Visit: Payer: Self-pay

## 2024-01-15 ENCOUNTER — Other Ambulatory Visit (HOSPITAL_COMMUNITY): Payer: Self-pay

## 2024-01-15 DIAGNOSIS — M6281 Muscle weakness (generalized): Secondary | ICD-10-CM | POA: Insufficient documentation

## 2024-01-15 DIAGNOSIS — M7989 Other specified soft tissue disorders: Secondary | ICD-10-CM | POA: Diagnosis not present

## 2024-01-15 DIAGNOSIS — M542 Cervicalgia: Secondary | ICD-10-CM | POA: Insufficient documentation

## 2024-01-15 DIAGNOSIS — R293 Abnormal posture: Secondary | ICD-10-CM | POA: Insufficient documentation

## 2024-01-15 MED ORDER — WEGOVY 0.25 MG/0.5ML ~~LOC~~ SOAJ
0.2500 mg | SUBCUTANEOUS | 2 refills | Status: DC
  Filled 2024-01-15: qty 2, 28d supply, fill #0

## 2024-01-15 NOTE — Addendum Note (Signed)
 Addended by: Ernestina Headland on: 01/15/2024 09:10 AM   Modules accepted: Orders
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
## 2024-01-16 ENCOUNTER — Other Ambulatory Visit (HOSPITAL_COMMUNITY): Payer: Self-pay

## 2024-01-16 NOTE — Telephone Encounter (Signed)
 Pharmacy Patient Advocate Encounter   Received notification from CoverMyMeds that prior authorization for WEGOVY  0.25MG  is required/requested.   Insurance verification completed.   The patient is insured through [employer] .   PA required; PA submitted to above mentioned insurance via CoverMyMeds Key/confirmation #/EOC [id number]. Status is pending

## 2024-01-20 ENCOUNTER — Encounter: Payer: Self-pay | Admitting: *Deleted

## 2024-01-20 ENCOUNTER — Other Ambulatory Visit (HOSPITAL_COMMUNITY): Payer: Self-pay

## 2024-01-21 ENCOUNTER — Encounter: Payer: Self-pay | Admitting: Physical Therapy

## 2024-01-21 ENCOUNTER — Other Ambulatory Visit (HOSPITAL_COMMUNITY): Payer: Self-pay

## 2024-01-21 ENCOUNTER — Ambulatory Visit: Payer: Self-pay | Admitting: Physical Therapy

## 2024-01-21 DIAGNOSIS — M542 Cervicalgia: Secondary | ICD-10-CM | POA: Diagnosis not present

## 2024-01-21 DIAGNOSIS — R293 Abnormal posture: Secondary | ICD-10-CM

## 2024-01-21 DIAGNOSIS — M6281 Muscle weakness (generalized): Secondary | ICD-10-CM

## 2024-01-21 DIAGNOSIS — M7989 Other specified soft tissue disorders: Secondary | ICD-10-CM
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
## 2024-01-21 NOTE — Telephone Encounter (Signed)
 Pharmacy Patient Advocate Encounter  Received notification from [facility] that Prior Authorization for WEGOVY  has been DENIED.  Full denial letter will be uploaded to the media tab. See denial reason below.  We denied your request because this drug (Wegovy ) is not a benefit on your plan when used for weight loss alone, it is not covered.  We based this decision on your Pharmacy Health Plan Benefits. Medications that are not medically necessary are an exclusion under your plan benefits and are not covered.  Will attempt to submit on secondary insurance, managed medicaid.   PA #/Case ID/Reference #: [id number]

## 2024-01-22 NOTE — Telephone Encounter (Signed)
 Pharmacy Patient Advocate Encounter   Insurance verification completed.   The patient is insured through [facility] MEDICAID .   PA required; PA submitted to above mentioned insurance via CoverMyMeds Key/confirmation #/EOC [employer]. Status is pending

## 2024-01-23 ENCOUNTER — Other Ambulatory Visit: Payer: Self-pay | Admitting: Student

## 2024-01-23 ENCOUNTER — Other Ambulatory Visit (HOSPITAL_COMMUNITY): Payer: Self-pay

## 2024-01-23 DIAGNOSIS — I10 Essential (primary) hypertension: Secondary | ICD-10-CM

## 2024-01-23 NOTE — Telephone Encounter (Signed)
 Reached out to appeals dept. Per rep, a formal letter from office must be typed up and faxed to [phone] with reasoning as to why med needs appeal.   Will type quick letter explaining that patient is restarting on starting dose due to not losing required weight, and a new PA is needed due to previous one expiring. Will attach 01/01/24 appt chart notes as well.

## 2024-01-23 NOTE — Telephone Encounter (Signed)
 Pharmacy Patient Advocate Encounter  Received notification from [facility] MEDICAID that Prior Authorization for WEGOVY  0.25MG  has been CANCELLED due to  [facility] Prior Authorization Team is not able to review this request because it is a duplicate of a previous request and contains identical clinical information. Please note, this product was denied and decision letters were sent on 01/02/2024.  Refer to the original case [id number] and if there is additional clinical information to add/review, please resubmit for further consideration. If there is not any new clinical information, please review the original decision letter for alternatives and/or next steps.  Will reach out to insurance to inform them this PA is considered an initial authorization and the previous one submitted was sent as a Water quality scientist.   Patient starting over.   PA #/Case ID/Reference #: [id number]

## 2024-01-26 ENCOUNTER — Encounter: Payer: Self-pay | Admitting: Physical Therapy

## 2024-01-26 ENCOUNTER — Ambulatory Visit: Admitting: Physical Therapy

## 2024-01-26 DIAGNOSIS — M7989 Other specified soft tissue disorders: Secondary | ICD-10-CM

## 2024-01-26 DIAGNOSIS — R293 Abnormal posture: Secondary | ICD-10-CM

## 2024-01-26 DIAGNOSIS — M6281 Muscle weakness (generalized): Secondary | ICD-10-CM

## 2024-01-26 DIAGNOSIS — M542 Cervicalgia: Secondary | ICD-10-CM
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
## 2024-01-27 ENCOUNTER — Other Ambulatory Visit (HOSPITAL_COMMUNITY): Payer: Self-pay

## 2024-01-27 NOTE — Telephone Encounter (Signed)
 Faxed letter to insurance.   Fax: [phone]

## 2024-01-28 ENCOUNTER — Telehealth: Payer: Self-pay | Admitting: Physical Therapy

## 2024-01-28 ENCOUNTER — Ambulatory Visit: Admitting: Physical Therapy

## 2024-01-28 NOTE — Telephone Encounter (Signed)
 An attempt was made to contact pt via phone call at 3:25 PM regarding missed appointment at 1445. A VM was left educating pt regarding attendance policy, effect of current POC, and next appointment time.   Albesa Huguenin, PT, DPT 01/28/2024, 3:25 PM
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
## 2024-02-02 ENCOUNTER — Ambulatory Visit

## 2024-02-02 DIAGNOSIS — M7989 Other specified soft tissue disorders: Secondary | ICD-10-CM | POA: Diagnosis not present

## 2024-02-02 DIAGNOSIS — R293 Abnormal posture: Secondary | ICD-10-CM

## 2024-02-02 DIAGNOSIS — M6281 Muscle weakness (generalized): Secondary | ICD-10-CM | POA: Diagnosis not present

## 2024-02-02 DIAGNOSIS — M542 Cervicalgia: Secondary | ICD-10-CM

## 2024-02-03 NOTE — Telephone Encounter (Signed)
 Pt returned call.   Form completed on patients behalf and faxed back to [facility] community plan appeals.

## 2024-02-03 NOTE — Telephone Encounter (Signed)
 Left message requesting call back in regards to appeal. Call back [phone]

## 2024-02-04 ENCOUNTER — Ambulatory Visit: Admitting: Physical Therapy

## 2024-02-10 ENCOUNTER — Ambulatory Visit: Attending: Family Medicine | Admitting: Physical Therapy

## 2024-03-11 ENCOUNTER — Other Ambulatory Visit: Payer: Self-pay | Admitting: Family Medicine

## 2024-03-11 DIAGNOSIS — Z1231 Encounter for screening mammogram for malignant neoplasm of breast: Secondary | ICD-10-CM

## 2024-03-15 ENCOUNTER — Other Ambulatory Visit: Payer: Self-pay | Admitting: Family Medicine

## 2024-03-15 DIAGNOSIS — Z1231 Encounter for screening mammogram for malignant neoplasm of breast: Secondary | ICD-10-CM

## 2024-03-26 ENCOUNTER — Other Ambulatory Visit: Payer: Self-pay

## 2024-03-26 ENCOUNTER — Other Ambulatory Visit (HOSPITAL_COMMUNITY): Payer: Self-pay

## 2024-03-26 ENCOUNTER — Ambulatory Visit
Admission: RE | Admit: 2024-03-26 | Discharge: 2024-03-26 | Disposition: A | Discharge status: Home / Self Care | Source: Ambulatory Visit

## 2024-03-26 ENCOUNTER — Other Ambulatory Visit: Payer: Self-pay | Admitting: Family Medicine

## 2024-03-26 DIAGNOSIS — Z1231 Encounter for screening mammogram for malignant neoplasm of breast: Secondary | ICD-10-CM | POA: Diagnosis not present

## 2024-03-29 ENCOUNTER — Encounter: Payer: Self-pay | Admitting: Family Medicine

## 2024-03-29 ENCOUNTER — Other Ambulatory Visit (HOSPITAL_COMMUNITY): Payer: Self-pay

## 2024-03-29 ENCOUNTER — Ambulatory Visit (INDEPENDENT_AMBULATORY_CARE_PROVIDER_SITE_OTHER): Admitting: Family Medicine

## 2024-03-29 VITALS — BP 142/88 | HR 96 | Ht 64.0 in | Wt 243.0 lb

## 2024-03-29 DIAGNOSIS — M549 Dorsalgia, unspecified: Secondary | ICD-10-CM

## 2024-03-29 DIAGNOSIS — R7303 Prediabetes: Secondary | ICD-10-CM | POA: Diagnosis not present

## 2024-03-29 DIAGNOSIS — R0781 Pleurodynia: Secondary | ICD-10-CM | POA: Diagnosis not present

## 2024-03-29 DIAGNOSIS — Z6841 Body Mass Index (BMI) 40.0 and over, adult: Secondary | ICD-10-CM | POA: Diagnosis not present

## 2024-03-29 DIAGNOSIS — E66813 Obesity, class 3: Secondary | ICD-10-CM

## 2024-03-29 DIAGNOSIS — I10 Essential (primary) hypertension: Secondary | ICD-10-CM

## 2024-03-29 LAB — POCT GLYCOSYLATED HEMOGLOBIN (HGB A1C): Hemoglobin A1C: 5.2 % (ref 4.0–5.6)

## 2024-03-29 MED ORDER — IBUPROFEN 800 MG PO TABS
800.0000 mg | ORAL_TABLET | Freq: Three times a day (TID) | ORAL | 0 refills | Status: DC | PRN
  Filled 2024-03-29: qty 30, 10d supply, fill #0

## 2024-03-29 MED ORDER — MECLIZINE HCL 25 MG PO TABS
25.0000 mg | ORAL_TABLET | Freq: Three times a day (TID) | ORAL | 0 refills | Status: AC | PRN
  Filled 2024-03-29: qty 30, 10d supply, fill #0

## 2024-03-29 MED ORDER — LIDOCAINE 5 % EX PTCH
1.0000 | MEDICATED_PATCH | CUTANEOUS | 0 refills | Status: AC
  Filled 2024-03-29: qty 10, 10d supply, fill #0

## 2024-03-29 NOTE — Progress Notes (Signed)
    SUBJECTIVE:   CHIEF COMPLAINT / HPI:   BP f/u  Plans to take first dose of amlodpine 10mg  tonight  Stopped losartan , caused dizziness  Interested in glp1 for weight loss Has had issues with insurance coverage in the past  PERTINENT  PMH / PSH: htn, prediabetes  OBJECTIVE:   BP (!) 142/99   Pulse 96   Ht 5' 4 (1.626 m)   Wt 243 lb (110.2 kg)   SpO2 100%   BMI 41.71 kg/m   General: well appearing, NAD Cardiovascular: RRR, no m/r/g Respiratory: normal work of breathing on RA, CTAB  ASSESSMENT/PLAN:   Assessment & Plan Essential hypertension Rx amlodipine  10mg  12/2023, has not started taking higher dose yet Plans to start tonight Elevated BP x2 in office but improved from last visit Start amlodipine  10mg  Follow up in 2-3 months for BP recheck Prediabetes A1c improved to 5.2 today from 5.7 two years ago Recheck in 3 months Back pain, unspecified back location, unspecified back pain laterality, unspecified chronicity Chronic, stable Refilled lidocaine  patches Class 3 severe obesity with body mass index (BMI) of 40.0 to 44.9 in adult, unspecified obesity type, unspecified whether serious comorbidity present Pt has been working on lifestyle modifications Has lost 7lbs since last visit Interested in GLP1 for weight loss Messaged pharmacist for recommendations on what would be covered by insurance   Advised pt to schedule appt for pap   Christine Prescott, DO [facility]

## 2024-03-29 NOTE — Assessment & Plan Note (Signed)
 Rx amlodipine  10mg  12/2023, has not started taking higher dose yet Plans to start tonight Elevated BP x2 in office but improved from last visit Start amlodipine  10mg  Follow up in 2-3 months for BP recheck

## 2024-03-29 NOTE — Assessment & Plan Note (Signed)
 Chronic, stable Refilled lidocaine  patches

## 2024-03-29 NOTE — Patient Instructions (Signed)
 Good to see you today - Thank you for coming in  Things we discussed today:  I will send a message to [place] to see if we could get your insurance to help cover Mounjaro  Please schedule your pap  Let me know if you have negative side effects of Amlodipine   Your goal blood pressure is less than 130/80  Check your blood pressure several times a week if able, particularly in the morning  If regularly higher than this please let me know - either with MyChart or leaving a phone message. Next visit please bring in your blood pressure cuff.     Come back to see me in 2-3 months for BP check

## 2024-03-30 ENCOUNTER — Ambulatory Visit: Payer: Self-pay | Admitting: Family Medicine

## 2024-03-31 ENCOUNTER — Other Ambulatory Visit (HOSPITAL_COMMUNITY): Payer: Self-pay

## 2024-04-01 ENCOUNTER — Encounter: Payer: Self-pay | Admitting: Family Medicine

## 2024-04-01 ENCOUNTER — Telehealth: Payer: Self-pay

## 2024-04-01 NOTE — Telephone Encounter (Signed)
 Prior authorization submitted for LIDOCAINE  5% PATCHES to [employer] via Lat[employer].   Key: [id number]

## 2024-04-02 NOTE — Telephone Encounter (Signed)
 Pharmacy Patient Advocate Encounter  Received notification from [facility] that Prior Authorization for LIDOCAINE  5% PATCHES has been DENIED.  Full denial letter will be uploaded to the media tab. See denial reason below.  Insufficient clinical evidence for lidocaine  5% patch use in rib pain; approved only for post-herpetic neuralgia per criteria of Lidoderm /Tridacaine .   Lidocaine  4% patches available OTC   PA #/Case ID/Reference #: [id number]

## 2024-04-07 ENCOUNTER — Other Ambulatory Visit (HOSPITAL_COMMUNITY): Payer: Self-pay

## 2024-05-10 ENCOUNTER — Other Ambulatory Visit (HOSPITAL_COMMUNITY): Payer: Self-pay

## 2024-05-10 ENCOUNTER — Ambulatory Visit (INDEPENDENT_AMBULATORY_CARE_PROVIDER_SITE_OTHER): Admitting: Student

## 2024-05-10 DIAGNOSIS — J309 Allergic rhinitis, unspecified: Secondary | ICD-10-CM | POA: Diagnosis not present

## 2024-05-10 DIAGNOSIS — M549 Dorsalgia, unspecified: Secondary | ICD-10-CM | POA: Diagnosis not present

## 2024-05-10 MED ORDER — FLUTICASONE PROPIONATE 50 MCG/ACT NA SUSP
1.0000 | Freq: Every day | NASAL | 1 refills | Status: AC
  Filled 2024-05-10: qty 16, 60d supply, fill #0

## 2024-05-10 MED ORDER — IBUPROFEN 800 MG PO TABS
800.0000 mg | ORAL_TABLET | Freq: Three times a day (TID) | ORAL | 0 refills | Status: AC | PRN
  Filled 2024-05-10: qty 30, 10d supply, fill #0

## 2024-05-10 NOTE — Patient Instructions (Addendum)
 Pleasure to meet you today.  Suspect your symptoms are most likely due to ongoing viral upper respiratory illness.  I recommend making sure you stay hydrated with enough water.  Also you can do Tylenol  or ibuprofen  for any aches and pain with the cough.  Enourage use of warm water, honey and lemon to help with the cough.  I recommend over the counter Mucinex which you should take twice daily for at least 5-7 days.  Use the nasal saline provided to you today to irrigate your nose to help with the runny nose.

## 2024-05-10 NOTE — Progress Notes (Signed)
    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   PERTINENT  PMH / PSH: Reviewed   OBJECTIVE:   BP 126/72   Pulse 95   Temp 98.8 F (37.1 C)   Wt 253 lb 3.2 oz (114.9 kg)   SpO2 100%   BMI 43.46 kg/m    Physical Exam General: Alert, well appearing, NAD HEENT: MMM., mild oropharyngeal erythema, no tonsillar exudate or cervical lymphadenopathy Cardiovascular: RRR, No Murmurs, Normal S2/S2 Respiratory: CTAB, No wheezing or Rales Abdomen: No distension or tenderness  ASSESSMENT/PLAN:    Viral illness 43 y.o. year old presents with myalgia, cough, congestion, rhinorrhea and sore throat. She is afebrile today and on exam has oropharyngeal erythema,  good work of breathing on RA and clear breath sounds bilaterally. Overall presentation and exam is consistent with viral URI.   - Discussed conservative management with warm water, honey and lemon. - Recommended tylenol  or ibuprofen  for fever.  - Encouraged adequate hydration for patient.  - Rx Flonace and  - Recommend Mucinex for sputum and cough - Outline signs and symptoms that will warrant ED visit or return for further assessment.    Norleen Kathi, MD [facility]

## 2024-05-12 ENCOUNTER — Other Ambulatory Visit: Payer: Self-pay

## 2024-05-12 ENCOUNTER — Emergency Department (HOSPITAL_BASED_OUTPATIENT_CLINIC_OR_DEPARTMENT_OTHER): Admitting: Radiology

## 2024-05-12 ENCOUNTER — Emergency Department (HOSPITAL_BASED_OUTPATIENT_CLINIC_OR_DEPARTMENT_OTHER)
Admission: EM | Admit: 2024-05-12 | Discharge: 2024-05-12 | Disposition: A | Discharge status: Home / Self Care | Attending: Emergency Medicine | Admitting: Emergency Medicine

## 2024-05-12 DIAGNOSIS — I10 Essential (primary) hypertension: Secondary | ICD-10-CM | POA: Insufficient documentation

## 2024-05-12 DIAGNOSIS — R0602 Shortness of breath: Secondary | ICD-10-CM | POA: Diagnosis not present

## 2024-05-12 DIAGNOSIS — J069 Acute upper respiratory infection, unspecified: Secondary | ICD-10-CM | POA: Diagnosis not present

## 2024-05-12 DIAGNOSIS — R059 Cough, unspecified: Secondary | ICD-10-CM | POA: Diagnosis present

## 2024-05-12 DIAGNOSIS — B9789 Other viral agents as the cause of diseases classified elsewhere: Secondary | ICD-10-CM | POA: Diagnosis not present

## 2024-05-12 DIAGNOSIS — Z79899 Other long term (current) drug therapy: Secondary | ICD-10-CM | POA: Diagnosis not present

## 2024-05-12 LAB — CBC
HCT: 40 % (ref 36.0–46.0)
Hemoglobin: 13 g/dL (ref 12.0–15.0)
MCH: 29 pg (ref 26.0–34.0)
MCHC: 32.5 g/dL (ref 30.0–36.0)
MCV: 89.1 fL (ref 80.0–100.0)
Platelets: 274 K/uL (ref 150–400)
RBC: 4.49 MIL/uL (ref 3.87–5.11)
RDW: 13.5 % (ref 11.5–15.5)
WBC: 8.5 K/uL (ref 4.0–10.5)
nRBC: 0 % (ref 0.0–0.2)

## 2024-05-12 LAB — BASIC METABOLIC PANEL WITH GFR
Anion gap: 12 (ref 5–15)
BUN: 10 mg/dL (ref 6–20)
CO2: 24 mmol/L (ref 22–32)
Calcium: 9.6 mg/dL (ref 8.9–10.3)
Chloride: 99 mmol/L (ref 98–111)
Creatinine, Ser: 0.76 mg/dL (ref 0.44–1.00)
GFR, Estimated: >60 mL/min (ref >60)
Glucose, Bld: 108 mg/dL — ABNORMAL HIGH (ref 70–99)
Potassium: 3.7 mmol/L (ref 3.5–5.1)
Sodium: 135 mmol/L (ref 135–145)

## 2024-05-12 LAB — RESP PANEL BY RT-PCR (RSV, FLU A&B, COVID)  RVPGX2
Influenza A by PCR: NEGATIVE
Influenza B by PCR: NEGATIVE
Resp Syncytial Virus by PCR: NEGATIVE
SARS Coronavirus 2 by RT PCR: NEGATIVE

## 2024-05-12 MED ORDER — PROMETHAZINE-DM 6.25-15 MG/5ML PO SYRP: 5.0000 mL | ORAL_SOLUTION | Freq: Four times a day (QID) | ORAL | 0 refills | Status: AC | PRN

## 2024-05-12 NOTE — Discharge Instructions (Signed)
 You were seen in the ER today for concerns of a cough and congestion. Your labs and imaging were reassuring with no signs of pneumonia, COVID, influenza, or RSV. I do suspect you likely have a viral upper respiratory infection causing your current syptoms. I have sent a prescription cough medication to your pharmacy to use over the next several days. Continue taking Tylenol  or ibuprofen  for fevers or pain. Follow up with your primary care provider for further evaluation.

## 2024-05-12 NOTE — ED Notes (Signed)
 Reviewed discharge instructions, medications, and home care with pt. Pt verbalized understanding and had no further questions. Pt exited ED without complications.

## 2024-05-12 NOTE — ED Provider Notes (Signed)
 [place] [facility]ERGENCY DEPARTMENT AT [facility] Provider Note   CSN: [id number] Arrival date & time: 05/12/24  1911     Patient presents with: Shortness of Breath   Christine Parks is a 43 y.o. female.  Patient with past history significant for obesity, hypertension, palpitations presents the emergency department concerns of shortness of breath and cough.  Reports that she was diagnosed with a respiratory infection about 3 days ago by her PCPs office.  Denies any nausea, vomiting, diarrhea.    Shortness of Breath      Prior to Admission medications   Medication Sig Start Date End Date Taking? Authorizing Provider  promethazine-dextromethorphan (PROMETHAZINE-DM) 6.25-15 MG/5ML syrup Take 5 mLs by mouth 4 (four) times daily as needed for cough. 05/12/24  Yes Aarit Kashuba A, PA-C  amLODipine  (NORVASC ) 10 MG tablet Take 1 tablet (10 mg total) by mouth at bedtime. 01/01/24   Bryan Bianchi, MD  fluticasone  (FLONASE ) 50 MCG/ACT nasal spray Place 1 spray into both nostrils daily. 05/10/24   Rosendo Rush, MD  ibuprofen  (ADVIL ) 800 MG tablet Take 1 tablet (800 mg total) by mouth every 8 (eight) hours as needed. 05/10/24   Rosendo Rush, MD  lidocaine  (LIDODERM ) 5 % Place 1 patch onto the skin daily. Remove & Discard patch within 12 hours or as directed by MD 03/29/24   Everhart, Kirstie, DO  meclizine  (ANTIVERT ) 25 MG tablet Take 1 tablet (25 mg total) by mouth 3 (three) times daily as needed for dizziness. 03/29/24   Everhart, Kirstie, DO    Allergies: Penicillins    Review of Systems  Respiratory:  Positive for shortness of breath.   All other systems reviewed and are negative.   Updated Vital Signs BP (!) 161/100   Pulse (!) 126   Temp 98.6 F (37 C)   Resp 20   Ht 5' 4 (1.626 m)   Wt 112.5 kg   SpO2 98%   BMI 42.57 kg/m   Physical Exam Vitals and nursing note reviewed.  Constitutional:      General: She is not in acute distress.    Appearance: She is  well-developed.  HENT:     Head: Normocephalic and atraumatic.  Eyes:     Conjunctiva/sclera: Conjunctivae normal.  Cardiovascular:     Rate and Rhythm: Normal rate and regular rhythm.     Heart sounds: No murmur heard. Pulmonary:     Effort: Pulmonary effort is normal. No respiratory distress.     Breath sounds: Normal breath sounds. No decreased breath sounds, wheezing, rhonchi or rales.  Abdominal:     Palpations: Abdomen is soft.     Tenderness: There is no abdominal tenderness.  Musculoskeletal:        General: No swelling.     Cervical back: Neck supple.  Skin:    General: Skin is warm and dry.     Capillary Refill: Capillary refill takes less than 2 seconds.  Neurological:     Mental Status: She is alert.  Psychiatric:        Mood and Affect: Mood normal.     (all labs ordered are listed, but only abnormal results are displayed) Labs Reviewed  BASIC METABOLIC PANEL WITH GFR - Abnormal; Notable for the following components:      Result Value   Glucose, Bld 108 (*)    All other components within normal limits  RESP PANEL BY RT-PCR (RSV, FLU A&B, COVID)  RVPGX2  CBC    EKG: None  Radiology:  DG Chest 2 View Result Date: 05/12/2024 CLINICAL DATA:  SOB EXAM: CHEST - 2 VIEW COMPARISON:  January 11, 2022 FINDINGS: No focal airspace consolidation, pleural effusion, or pneumothorax. No cardiomegaly.No acute fracture or destructive lesion. Multilevel degenerative disc disease of the spine. IMPRESSION: No acute cardiopulmonary abnormality. Electronically Signed   By: Rogelia Myers M.D.   On: 05/12/2024 19:40     Procedures   Medications Ordered in the ED - No data to display                                  Medical Decision Making Amount and/or Complexity of Data Reviewed Labs: ordered. Radiology: ordered.  Risk Prescription drug management.   This patient presents to the ED for concern of URI, SOB. Differential diagnosis includes COVID-19, viral URI,  pneumonia   Lab Tests:  I Ordered, and personally interpreted labs.  The pertinent results include: CBC unremarkable, BMP unremarkable, respiratory panel negative.   Imaging Studies ordered:  I ordered imaging studies including chest x-ray I independently visualized and interpreted imaging which showed no acute cardiopulmonary process I agree with the radiologist interpretation   Problem List / ED Course:  Patient with past history significant for obesity, hypertension, palpitations presents the emergency department concerns of shortness of breath and a cough.  Reportedly was diagnosed with a respiratory infection several days ago with her primary care provider's office.  She states that she has nonproductive coughing and intermittent fevers.  Has been trying over-the-counter medications with minimal improvement. On exam, patient is well-appearing with no abnormal heart or lung sounds.  Patient has low-grade tachycardia which I suspect is likely secondary to viral URI.  Will add on basic labs and imaging for evaluation of other potential causes of symptoms. CBC, BMP, and respiratory panel are reassuring and unremarkable.  Chest x-ray negative for any acute findings. Given workup and patient is otherwise clinically well appearance consistent with viral infection, will start on a course of prescription cough medication.  Encouraged use of Tylenol  and ibuprofen  for pain or fevers.  Advise close follow-up with PCP.  Return precautions discussed.  Discharged home in stable condition.   Social Determinants of Health:  None  Final diagnoses:  Viral URI with cough    ED Discharge Orders          Ordered    promethazine-dextromethorphan (PROMETHAZINE-DM) 6.25-15 MG/5ML syrup  4 times daily PRN        05/12/24 2124               Lashunta Frieden A, PA-C 05/12/24 2130    Mannie Pac T, DO 05/17/24 [phone]

## 2024-05-12 NOTE — ED Notes (Addendum)
 Pt states she has not taking her BP medication today. Last BP 156/113. Denies blurry vision or dizziness. NAD noted.

## 2024-05-12 NOTE — ED Triage Notes (Signed)
 Pt POV reporting SOB r/t URI x3 days, seen at PCP 9/29, not given any meds.

## 2024-06-30 ENCOUNTER — Ambulatory Visit (INDEPENDENT_AMBULATORY_CARE_PROVIDER_SITE_OTHER): Admitting: Family Medicine

## 2024-06-30 ENCOUNTER — Other Ambulatory Visit: Payer: Self-pay | Admitting: Family Medicine

## 2024-06-30 ENCOUNTER — Encounter: Payer: Self-pay | Admitting: Family Medicine

## 2024-06-30 VITALS — BP 130/98 | HR 100 | Temp 98.2°F | Ht 64.0 in | Wt 233.6 lb

## 2024-06-30 DIAGNOSIS — Z780 Asymptomatic menopausal state: Secondary | ICD-10-CM | POA: Diagnosis not present

## 2024-06-30 DIAGNOSIS — J069 Acute upper respiratory infection, unspecified: Secondary | ICD-10-CM | POA: Diagnosis not present

## 2024-06-30 MED ORDER — ESTRADIOL 0.01 % VA CREA: 1.0000 | TOPICAL_CREAM | Freq: Every day | VAGINAL | 12 refills | Status: DC

## 2024-06-30 NOTE — Patient Instructions (Signed)
 It was great to see you!  Our plans for today:  - Try the vaginal estrogen cream. Let us  know if you don't tolerate this.  - Your upper respiratory infection should start to get better about 7 - 10 days after it started.    For your nasal congestion and runny nose, try using Afrin (generic is Oxymetazoline) twice daily for 3 days.  Do not use for longer that 3 days.    Some other therapies you can try are: push fluids, rest, use vaporizer or mist as needed, and return office visit as needed if symptoms persist or worsen.   Drinking warm liquids such as teas and soups can help with secretions and cough. A mist humidifier or vaporizer can work well to help with secretions and cough.  It is very important to clean the humidifier between use according to the instructions.    It was good to see you.  If you're still having trouble in the next week, come back and see us .    Of course, if you start having trouble breathing, worsening fevers, vomiting and unable to hold down any fluids, or you have other concerns, don't hesitate to come back or go to the ED after hours.   Take care and seek immediate care sooner if you develop any concerns.   Dr. Leler Brion

## 2024-06-30 NOTE — Progress Notes (Signed)
    SUBJECTIVE:   CHIEF COMPLAINT / HPI:   Discussed the use of AI scribe software for clinical note transcription with the patient, who gave verbal consent to proceed.  History of Present Illness Christine Parks is a 43 year old female who presents with symptoms suggestive of a sinus infection.  Upper respiratory symptoms - Sinus symptoms present for three days - Phlegm production causing gagging - Gray phlegm with cough - No chest pain or difficulty breathing - Symptoms began with fatigue - Works at [employer] with daily exposure to many patients - Uses Alka-Seltzer for sinus pressure - Avoids Mucinex due to excessive drying effects  Menopausal symptoms - History of hysterectomy in 2017 - Experiences hot flashes and severe vaginal dryness - Vaginal dryness impacts comfort and sexual activity - Previously trialed vaginal estrogen cream, willing to try again - Not currently using medication for menopausal symptoms     OBJECTIVE:   BP (!) 130/98   Pulse 100   Temp 98.2 F (36.8 C)   Ht 5' 4 (1.626 m)   Wt 233 lb 9.6 oz (106 kg)   SpO2 100%   BMI 40.10 kg/m   Gen: well appearing, in NAD HEENT: orophyarynx clear without exudate or erythema. Uvula midline. No tonsillar enlargement. Good dentition. TM visible b/l without bulging, purulence. Slight erythema bilaterally. No cervical or supraclavicular lymphadenopathy. Card: RRR Lungs: CTAB Ext: WWP, no edema  ASSESSMENT/PLAN:   Menopause Post-hysterectomy with intact ovaries. Previous trial of vaginal estrogen cream was ineffective. Discussed venlafaxine for vasomotor symptoms and Veozah, though insurance coverage may be an issue. - Prescribed vaginal estrogen cream for dryness. - Recommended [place] Jelly bead for lubrication. - Discussed venlafaxine for vasomotor symptoms if needed.   Acute upper respiratory infection Likely viral etiology. Symptoms expected to resolve in 2 weeks. Antibiotics not  indicated unless symptoms persist beyond 7-10 days or worsen. - Advised rest and hydration. - Recommended warm liquids, teas, and soups for throat soothing. - Suggested honey for cough relief. - Recommended DayQuil, Theraflu, or Robitussin for symptom management. - Provided work note for rest today and tomorrow. - Instructed to monitor symptoms and consider antibiotics if symptoms persist beyond 7-10 days or worsen.   Donald CHRISTELLA Lai, DO

## 2024-06-30 NOTE — Assessment & Plan Note (Signed)
 Post-hysterectomy with intact ovaries. Previous trial of vaginal estrogen cream was ineffective. Discussed venlafaxine for vasomotor symptoms and Veozah, though insurance coverage may be an issue. - Prescribed vaginal estrogen cream for dryness. - Recommended KY Jelly bead for lubrication. - Discussed venlafaxine for vasomotor symptoms if needed.

## 2024-07-01 ENCOUNTER — Other Ambulatory Visit: Payer: Self-pay | Admitting: Family Medicine

## 2024-07-05 MED ORDER — ESTRADIOL 0.01 % VA CREA: TOPICAL_CREAM | VAGINAL | 12 refills | Status: AC

## 2024-07-05 NOTE — Addendum Note (Signed)
 Addended by: MADELON DONALD HERO on: 07/05/2024 04:53 PM   Modules accepted: Orders

## 2024-07-05 NOTE — Telephone Encounter (Signed)
 Received call from pharmacy regarding Estrace  prescription. They never received rx with 1 g directions.   Pharmacist would also like rx to indicate how long patient should be doing 1 g nightly. She states that sometimes this is for 1-2 weeks and then decreased. Requesting new rx with this clarification.   Forwarding to prescriber.   Chiquita JAYSON English, RN

## 2024-08-08 DIAGNOSIS — J111 Influenza due to unidentified influenza virus with other respiratory manifestations: Secondary | ICD-10-CM | POA: Diagnosis not present

## 2024-08-10 DIAGNOSIS — R062 Wheezing: Secondary | ICD-10-CM | POA: Diagnosis not present

## 2024-08-10 DIAGNOSIS — R051 Acute cough: Secondary | ICD-10-CM | POA: Diagnosis not present

## 2024-08-10 DIAGNOSIS — J069 Acute upper respiratory infection, unspecified: Secondary | ICD-10-CM | POA: Diagnosis not present

## 2024-08-10 DIAGNOSIS — R0981 Nasal congestion: Secondary | ICD-10-CM | POA: Diagnosis not present
